# Patient Record
Sex: Male | Born: 1985
Health system: Southern US, Community
[De-identification: ages and names within clinical notes are randomized; demographics above are authoritative.]

## PROBLEM LIST (undated history)

## (undated) DIAGNOSIS — M109 Gout, unspecified: Secondary | ICD-10-CM

## (undated) DIAGNOSIS — M1990 Unspecified osteoarthritis, unspecified site: Secondary | ICD-10-CM

## (undated) DIAGNOSIS — N182 Chronic kidney disease, stage 2 (mild): Secondary | ICD-10-CM

## (undated) DIAGNOSIS — I10 Essential (primary) hypertension: Secondary | ICD-10-CM

## (undated) DIAGNOSIS — E669 Obesity, unspecified: Secondary | ICD-10-CM

## (undated) HISTORY — DX: Essential (primary) hypertension: I10

## (undated) HISTORY — DX: Chronic kidney disease, stage 2 (mild): N18.2

## (undated) HISTORY — DX: Obesity, unspecified: E66.9

---
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
## 2005-05-10 LAB — CONVERTED CEMR LAB
ALT: 37 units/L
CO2: 30 meq/L
Cholesterol: 151 mg/dL
Creatinine, Ser: 1.4 mg/dL
Direct LDL: 83 mg/dL
Total Bilirubin: 1.2 mg/dL
Total CHOL/HDL Ratio: 3.51
Triglycerides: 182 mg/dL

## 2005-10-17 ENCOUNTER — Emergency Department (HOSPITAL_COMMUNITY): Admission: EM | Admit: 2005-10-17 | Discharge: 2005-10-17 | Payer: Self-pay | Admitting: Emergency Medicine

## 2006-09-10 ENCOUNTER — Emergency Department (HOSPITAL_COMMUNITY): Admission: EM | Admit: 2006-09-10 | Discharge: 2006-09-10 | Payer: Self-pay | Admitting: Family Medicine

## 2006-12-13 LAB — CONVERTED CEMR LAB
CO2: 27 meq/L
Chloride: 106 meq/L
Creatinine, Ser: 1.4 mg/dL
Glucose, Bld: 97 mg/dL

## 2007-12-15 ENCOUNTER — Emergency Department (HOSPITAL_COMMUNITY): Admission: EM | Admit: 2007-12-15 | Discharge: 2007-12-15 | Payer: Self-pay | Admitting: Emergency Medicine

## 2008-02-09 ENCOUNTER — Encounter: Payer: Self-pay | Admitting: Internal Medicine

## 2008-02-09 LAB — CONVERTED CEMR LAB
Chloride: 101 meq/L
Creatinine, Ser: 1.7 mg/dL
Potassium: 4.2 meq/L
Sodium: 137 meq/L

## 2008-03-08 ENCOUNTER — Encounter: Payer: Self-pay | Admitting: Internal Medicine

## 2008-06-11 ENCOUNTER — Emergency Department (HOSPITAL_COMMUNITY): Admission: EM | Admit: 2008-06-11 | Discharge: 2008-06-11 | Payer: Self-pay | Admitting: Emergency Medicine

## 2008-11-27 ENCOUNTER — Emergency Department (HOSPITAL_COMMUNITY): Admission: EM | Admit: 2008-11-27 | Discharge: 2008-11-27 | Payer: Self-pay | Admitting: Emergency Medicine

## 2009-02-13 ENCOUNTER — Encounter: Payer: Self-pay | Admitting: Internal Medicine

## 2009-02-13 ENCOUNTER — Ambulatory Visit: Payer: Self-pay | Admitting: Internal Medicine

## 2009-02-13 DIAGNOSIS — I10 Essential (primary) hypertension: Secondary | ICD-10-CM | POA: Insufficient documentation

## 2009-02-13 DIAGNOSIS — E669 Obesity, unspecified: Secondary | ICD-10-CM | POA: Insufficient documentation

## 2009-06-09 ENCOUNTER — Encounter: Payer: Self-pay | Admitting: Internal Medicine

## 2009-09-16 ENCOUNTER — Telehealth: Payer: Self-pay | Admitting: Internal Medicine

## 2009-09-18 ENCOUNTER — Ambulatory Visit: Payer: Self-pay | Admitting: Internal Medicine

## 2009-09-22 LAB — CONVERTED CEMR LAB
CO2: 26 meq/L (ref 19–32)
Calcium: 9.1 mg/dL (ref 8.4–10.5)
Creatinine, Ser: 1.49 mg/dL (ref 0.40–1.50)
Glucose, Bld: 106 mg/dL — ABNORMAL HIGH (ref 70–99)
Hemoglobin, Urine: NEGATIVE
Ketones, ur: NEGATIVE mg/dL
Microalb Creat Ratio: 10.7 mg/g (ref 0.0–30.0)
Nitrite: NEGATIVE
Protein, ur: NEGATIVE mg/dL
Urobilinogen, UA: 0.2 (ref 0.0–1.0)

## 2009-10-06 ENCOUNTER — Ambulatory Visit: Payer: Self-pay | Admitting: Internal Medicine

## 2009-10-08 ENCOUNTER — Ambulatory Visit (HOSPITAL_COMMUNITY): Admission: RE | Admit: 2009-10-08 | Discharge: 2009-10-08 | Payer: Self-pay | Admitting: Internal Medicine

## 2009-10-13 ENCOUNTER — Telehealth: Payer: Self-pay | Admitting: Internal Medicine

## 2010-07-12 ENCOUNTER — Encounter: Payer: Self-pay | Admitting: Internal Medicine

## 2010-07-21 NOTE — Assessment & Plan Note (Signed)
Summary: NEED MEDICATION/ ALVAREZ/ SB.   Vital Signs:  Patient profile:   25 year old male Height:      71 inches (180.34 cm) Weight:      307.6 pounds (136.95 kg) BMI:     43.06 Temp:     97.6 degrees F (36.44 degrees C) oral Pulse rate:   83 / minute BP sitting:   144 / 86  (right arm) Cuff size:   large  Vitals Entered By: Theotis Barrio NT II (September 18, 2009 9:53 AM) CC: MEDICATION REFILL ON BP  MED.  - 90 DAY SUPPLY  . / NO OTHER CONCERNS Is Patient Diabetic? No Pain Assessment Patient in pain? no      Nutritional Status BMI of > 30 = obese  Have you ever been in a relationship where you felt threatened, hurt or afraid?No   Does patient need assistance? Functional Status Self care Ambulation Normal Comments MEDICATIO REFILL ON BP MED.  /  90 DAY SUPPLY / NO OTHER CONCERNS AT THIS TIME   CC:  MEDICATION REFILL ON BP  MED.  - 90 DAY SUPPLY  . / NO OTHER CONCERNS.  History of Present Illness: [name] Coleman is a 25 yo man with HTN and renal impairment. Last seen in the clinic about six months ago. He comes in today asking for med refills. Denies any new complaints, wants to have meds refilled 90 day supply.   Current Medications (verified): 1)  Cardizem Cd 120 Mg Xr24h-Cap (Diltiazem Hcl Coated Beads) .... Take 1 Tablet By Mouth Once A Day 2)  Atenolol 100 Mg Tabs (Atenolol) .... Take 1 Tablet By Mouth Once A Day  Allergies (verified): No Known Drug Allergies  Past History:  Past Medical History: Last updated: 02/13/2009 HTN Obesity ? 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   Lungs:  normal respiratory effort and normal breath sounds.   Heart:  normal rate, regular rhythm, no murmur, and no gallop.   Abdomen:  soft and non-tender.   Pulses:  normal peripheral pulses  Extremities:  no cyanosis, clubbing or edema  Neurologic:  non focal.   Impression & Recommendations:  Problem # 1:  ? of CHRONIC KIDNEY DISEASE STAGE II (MILD) (ICD-585.2) Cr. noted to be 1.7 on last exam. Will get a repeat one today. Will need to figure out why he has renal failure. I reviewed Dr. Augusto Gamble note from last visit. Hematuria in 2002 and a normal US kidney. I will start the work up today with repeat US kidney, urinalysis. Review in two week's time and will consider nephrology referral for further work up.   Orders: T-Urine Microalbumin w/creat. ratio [id number]) T-Urinalysis ([id number]) Ultrasound (Ultrasound)  Problem # 2:  OBESITY (ICD-278.00) Life style advice given.   Problem # 3:  ESSENTIAL HYPERTENSION, BENIGN (  ICD-401.1) BP noted to be high. Will increase atenolol to 100 mg. Review in two week's time.   The following medications were removed from the medication list:    Atenolol 100 Mg Tabs (Atenolol) .Marland Kitchen... Take 1/2 tablet by mouth daily His updated medication list for this problem includes:    Cardizem Cd 120 Mg Xr24h-cap (Diltiazem hcl coated beads) .Marland Kitchen... Take 1 tablet by mouth once a day    Atenolol 100 Mg Tabs (Atenolol) .Marland Kitchen... Take 1 tablet by mouth once a day  Orders: T-Basic Metabolic Panel [phone])  Complete Medication List: 1)  Cardizem Cd 120 Mg Xr24h-cap (Diltiazem hcl coated beads) .... Take 1 tablet by mouth once a day 2)  Atenolol 100 Mg Tabs (Atenolol) .... Take 1  tablet by mouth once a day  Patient Instructions: 1)  Please schedule a follow-up appointment in 2 weeks. Prescriptions: CARDIZEM CD 120 MG XR24H-CAP (DILTIAZEM HCL COATED BEADS) Take 1 tablet by mouth once a day  #90 x 2   Entered and Authorized by:   Zara Council MD   Signed by:   Zara Council MD on 09/18/2009   Method used:   Faxed to ...       [facility] (retail)       [address] [place], [place]  [address]       Ph: [phone]       Fax: [phone]   RxID:   [id number] ATENOLOL 100 MG TABS (ATENOLOL) Take 1 tablet by mouth once a day  #90 x 2   Entered and Authorized by:   Zara Council MD   Signed by:   Zara Council MD on 09/18/2009   Method used:   Faxed to ...       [facility] (retail)       [address] [place], [place]  [address]       Ph: [phone]       Fax: [phone]   RxID:   [id number]   Prevention & Chronic Care Immunizations   Influenza vaccine: Not documented   Influenza vaccine deferral: Deferred  (09/18/2009)    Tetanus booster: 05/10/2005: Historical    Pneumococcal vaccine: Not documented  Other Screening   Smoking status: quit  (02/13/2009)  Hypertension   Last Blood Pressure: 144 / 86  (09/18/2009)   Serum creatinine: 1.70  (02/09/2008)   BMP action: Ordered   Serum potassium 4.2  (02/09/2008)    Hypertension flowsheet reviewed?: Yes   Progress toward BP goal: Unchanged  Self-Management Support :   Personal Goals (by the next clinic visit) :      Personal blood pressure goal: 140/90  (09/18/2009)   Patient will work on the following items until the next clinic visit to reach self-care goals:     Medications and monitoring: take my medicines every day  (09/18/2009)     Eating: eat more vegetables, use fresh or frozen vegetables, eat foods that are low in salt, eat baked foods instead of fried foods, limit or avoid alcohol  (09/18/2009)    Hypertension  self-management support: Resources for patients handout  (09/18/2009)      Resource handout printed.  Process Orders Check Orders Results:     [facility]: ABN not required for this insurance Tests Sent for requisitioning (September 18, 2009 10:26 AM):     09/18/2009: [facility] -- T-Basic Metabolic Panel [phone] (signed)  09/18/2009: [facility] -- T-Urine Microalbumin w/creat. ratio [[id number]] (signed)     09/18/2009: [facility] -- T-Urinalysis [[id number]] (signed)    Appended Document: Orders Update    Clinical Lists Changes  Orders: Added new Referral order of Nephrology Referral (Nephro) - Signed

## 2010-07-21 NOTE — Progress Notes (Signed)
Summary: Results  Phone Note Outgoing Call   Call placed by: Angelina Ok RN,  October 13, 2009 9:04 AM Call placed to: Patient Summary of Call: Call to pt to inform him that his labs and MRI results were normal.  Message left for pt to call the Clinics. Angelina Ok RN  October 13, 2009 9:04 AM RTC call to pt given results of labs and MRI.  Pt wanted to know when he needs to follow up? Angelina Ok RN  October 13, 2009 12:22 PM   Initial call taken by: Angelina Ok RN,  October 13, 2009 9:04 AM  Follow-up for Phone Call        he can keep his apopintment for 1 month f/u from last visit. Follow-up by: Mariea Stable MD,  October 13, 2009 3:30 PM  Additional Follow-up for Phone Call Additional follow up Details #1::        RTC to pt.   Message left for pt to call the Clinics regarding an appointment. Additional Follow-up by: Angelina Ok RN,  October 15, 2009 4:10 PM    Additional Follow-up for Phone Call Additional follow up Details #2::    Thanks Follow-up by: Mariea Stable MD,  Oct 20, 2009 6:41 AM

## 2010-07-21 NOTE — Progress Notes (Signed)
Summary: refill/ hla  Phone Note Refill Request Message from:  Patient on September 16, 2009 1:56 PM  Refills Requested: Medication #1:  ATENOLOL 100 MG TABS take 1/2 tablet by mouth daily  Medication #2:  CARDIZEM CD 120 MG XR24H-CAP Take 1 tablet by mouth once a day. Initial call taken by: Marin Roberts RN,  September 16, 2009 1:56 PM  Follow-up for Phone Call        Pt needs appointment to be seen.  Was supposed to f/u in 2 weeks and didn't (now 6 months or so)  Will provide 1 month supply and no more   Follow-up by: Mariea Stable MD,  September 16, 2009 5:33 PM    Prescriptions: CARDIZEM CD 120 MG XR24H-CAP (DILTIAZEM HCL COATED BEADS) Take 1 tablet by mouth once a day  #30 x 0   Entered and Authorized by:   Mariea Stable MD   Signed by:   Mariea Stable MD on 09/16/2009   Method used:   Electronically to        [facility]* (retail)       [address] [place], [place]  [address]       Ph: [phone] or [phone]       Fax: [phone]   RxID:   [id number] ATENOLOL 100 MG TABS (ATENOLOL) take 1/2 tablet by mouth daily  #30 x 0   Entered and Authorized by:   Mariea Stable MD   Signed by:   Mariea Stable MD on 09/16/2009   Method used:   Electronically to        [facility]* (retail)       [address] [place], [place]  [address]       Ph: [phone] or [phone]       Fax: [phone]   RxID:   [id number]

## 2010-07-21 NOTE — Assessment & Plan Note (Signed)
Summary: EST-2 WEEK RECHECK/CH   Vital Signs:  Patient profile:   25 year old male Height:      71 inches (180.34 cm) Weight:      304.03 pounds (138.20 kg) BMI:     42.56 Temp:     97.5 degrees F (36.39 degrees C) oral Pulse rate:   73 / minute BP sitting:   119 / 76  (right arm)  Vitals Entered By: Angelina Ok RN (October 06, 2009 3:40 PM) CC: Depression Pain Assessment Patient in pain? no      Nutritional Status BMI of > 30 = obese  Have you ever been in a relationship where you felt threatened, hurt or afraid?No   Does patient need assistance? Functional Status Self care Ambulation Normal Comments Check up.  Ultrasound   CC:  Depression.  History of Present Illness: [name] is a 25 yo man with pMH as outlined below.  He is here for f/u of BP and renal function.  Please refer to his initial visit for details on concerns.  He recently saw Dr. Polly Cobia who increased his atenolol since BP was not at goal.  He is otherwise doing well without any complaints.   Depression History:      The patient denies a depressed mood most of the day and a diminished interest in his usual daily activities.         Preventive Screening-Counseling & Management  Alcohol-Tobacco     Smoking Status: quit     Year Quit: 2 YEARS  Current Medications (verified): 1)  Cardizem Cd 120 Mg Xr24h-Cap (Diltiazem Hcl Coated Beads) .... Take 1 Tablet By Mouth Once A Day 2)  Atenolol 100 Mg Tabs (Atenolol) .... 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 CKD stage 2  (baseline Cr 1.4)      -06/23/00 Renal US: normal (performed 2/2 hematuria)      -08/2009:  BMP, UA and microalbumin/Cr wnl  Family History: Father:  alive;  CVA, DM, HLD, MM, CKD Mother:  alive;  healthy No early cardiac history 1 brother:  healthy 3 sisters:  one with DM and HTN      -sis with HTN and CKD and ? RAS (undergoing angiogram)  Review of Systems      See HPI  Physical Exam  General:  alert and overweight-appearing.   Lungs:  normal respiratory effort and no accessory muscle use.   Neurologic:  non focal. Psych:  Oriented X3, memory intact for recent and remote, normally interactive, good eye contact, not anxious appearing, and not depressed appearing.     Impression & Recommendations:  Problem # 1:  ESSENTIAL HYPERTENSION, BENIGN (ICD-401.1)  will check aldo/renin ratio to r/o hyperaldo in setting of young person with hypertension will also check MRA renal arteries given small bump in Cr in the past with use of ACE-I (though potentially not large enough bump)  His updated medication list for this problem includes:    Cardizem Cd 120 Mg Xr24h-cap (Diltiazem hcl coated beads) .Marland Kitchen... Take 1 tablet by mouth once a day    Atenolol 100 Mg Tabs (  Atenolol) .Marland Kitchen... Take 1 tablet by mouth once a day  Orders: T- * Misc. Laboratory test [phone]) MRI (MRI)  Problem # 2:  ? of CHRONIC KIDNEY DISEASE STAGE II (MILD) (ICD-585.2) Unclear if true CKD or if high muscle mass giving borderline high Cr values      Cr stable, UA negative, Microalb/Cr wnl making renal dz less likely  will also check MRA renal arteries given small bump in Cr in the past with use of ACE-I (though potentially not large enough bump)  Orders: MRI (MRI)  Complete Medication List: 1)  Cardizem Cd 120 Mg Xr24h-cap (Diltiazem hcl coated beads) .... Take 1 tablet by mouth once a day 2)  Atenolol 100 Mg Tabs (Atenolol) .... Take 1 tablet by mouth once a day  Patient Instructions: 1)  Please schedule a  follow-up appointment in 1 month. 2)  Will check blood work today as discussed 3)  Will schedule MRA of the kidneys as discussed. 4)  If you have any problems before your next appointment, call clinic.   Prevention & Chronic Care Immunizations   Influenza vaccine: Not documented   Influenza vaccine deferral: Deferred  (09/18/2009)    Tetanus booster: 05/10/2005: Historical    Pneumococcal vaccine: Not documented  Other Screening   Smoking status: quit  (10/06/2009)  Hypertension   Last Blood Pressure: 119 / 76  (10/06/2009)   Serum creatinine: 1.49  (09/18/2009)   BMP action: Ordered   Serum potassium 3.6  (09/18/2009)  Self-Management Support :   Personal Goals (by the next clinic visit) :      Personal blood pressure goal: 140/90  (09/18/2009)   Patient will work on the following items until the next clinic visit to reach self-care goals:     Medications and monitoring: take my medicines every day, bring all of my medications to every visit  (10/06/2009)     Eating: drink diet soda or water instead of juice or soda, eat more vegetables, use fresh or frozen vegetables, eat foods that are low in salt, eat baked foods instead of fried foods, eat fruit for snacks and desserts, limit or avoid alcohol  (10/06/2009)     Activity: take a 30 minute walk every day, take the stairs instead of the elevator, park at the far end of the parking lot  (10/06/2009)    Hypertension self-management support: Written self-care plan, Education handout, Pre-printed educational material  (10/06/2009)   Hypertension self-care plan printed.   Hypertension education handout printed  Process Orders Check Orders Results:     [facility]: ABN not required for this insurance Tests Sent for requisitioning (October 06, 2009 5:06 PM):     10/06/2009: [facility] -- T- * Misc. Laboratory test [phone] (signed)     Vital Signs:  Patient profile:   25 year old male Height:       71 inches (180.34 cm) Weight:      304.03 pounds (138.20 kg) BMI:     42.56 Temp:     97.5 degrees F (36.39 degrees C) oral Pulse rate:   73 / minute BP sitting:   119 / 76  (right arm)  Vitals Entered By: Angelina Ok RN (October 06, 2009 3:40 PM)

## 2010-09-07 ENCOUNTER — Other Ambulatory Visit: Payer: Self-pay | Admitting: Internal Medicine

## 2010-09-08 NOTE — Telephone Encounter (Signed)
Patient should have an appointment soon, not urgent.

## 2010-09-16 ENCOUNTER — Other Ambulatory Visit: Payer: Self-pay | Admitting: Internal Medicine

## 2010-10-01 ENCOUNTER — Encounter: Payer: Self-pay | Admitting: Internal Medicine

## 2010-11-08 ENCOUNTER — Emergency Department (HOSPITAL_BASED_OUTPATIENT_CLINIC_OR_DEPARTMENT_OTHER)
Admission: EM | Admit: 2010-11-08 | Discharge: 2010-11-08 | Disposition: A | Discharge status: Home / Self Care | Payer: BC Managed Care – PPO | Attending: Emergency Medicine | Admitting: Emergency Medicine

## 2010-11-08 ENCOUNTER — Emergency Department (INDEPENDENT_AMBULATORY_CARE_PROVIDER_SITE_OTHER): Payer: BC Managed Care – PPO

## 2010-11-08 DIAGNOSIS — I10 Essential (primary) hypertension: Secondary | ICD-10-CM | POA: Insufficient documentation

## 2010-11-08 DIAGNOSIS — M25569 Pain in unspecified knee: Secondary | ICD-10-CM

## 2010-12-02 ENCOUNTER — Encounter: Payer: Self-pay | Admitting: Internal Medicine

## 2011-07-12 ENCOUNTER — Other Ambulatory Visit: Payer: Self-pay | Admitting: Internal Medicine

## 2012-01-17 ENCOUNTER — Other Ambulatory Visit: Payer: Self-pay | Admitting: *Deleted

## 2012-01-17 MED ORDER — DILTIAZEM HCL ER COATED BEADS 120 MG PO CP24: 120.0000 mg | ORAL_CAPSULE | Freq: Every day | ORAL | Status: DC

## 2012-10-30 IMAGING — US US EXTREM LOW VENOUS*R*
1 series · 14 of 24 positions shown · non-contrast
Comparison: None.

CLINICAL DATA: Right posterior knee pain

RIGHT LOWER EXTREMITY VENOUS DUPLEX ULTRASOUND
TECHNIQUE: Gray-scale sonography with graded compression, as well
as color Doppler and duplex ultrasound were performed to evaluate
the deep venous system of the lower extremity from the level of the
common femoral vein through the popliteal and proximal calf veins.
Spectral Doppler was utilized to evaluate flow at rest and with
distal augmentation maneuvers.

[Series 1: us extrem low venous*right* · 14 of 27 slices shown]
[im 1/27]
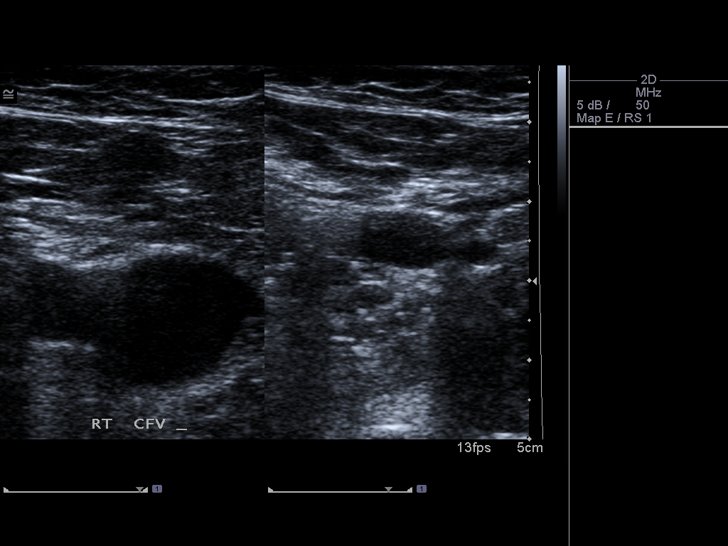
[im 3/27]
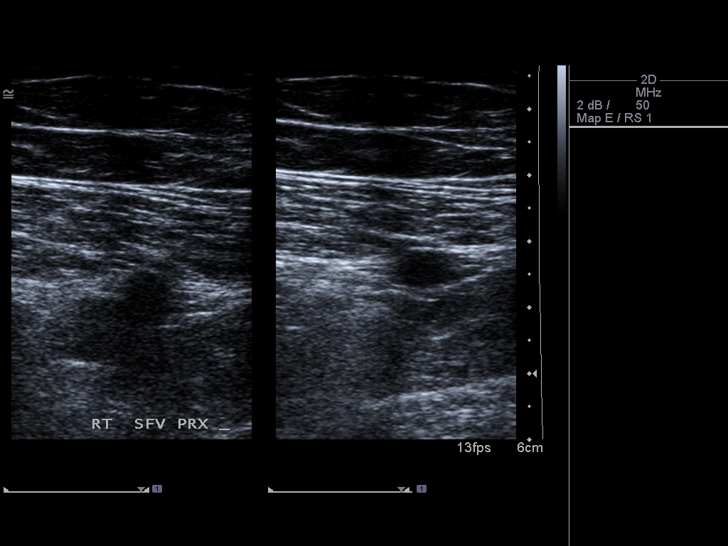
[im 5/27]
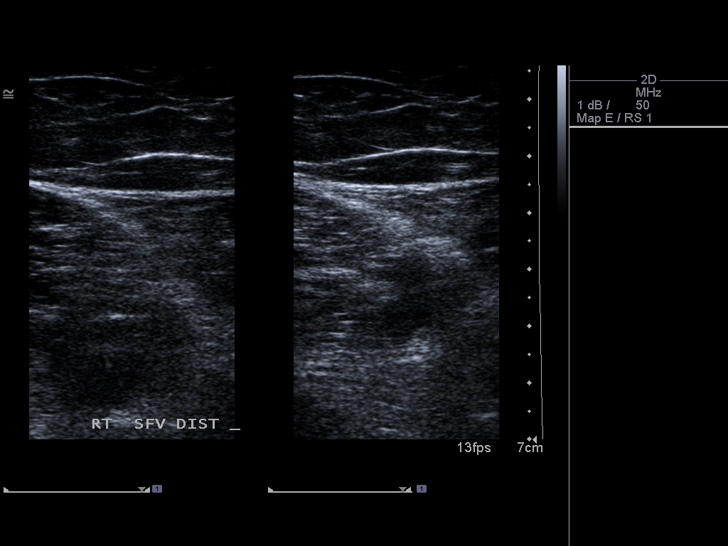
[im 7/27]
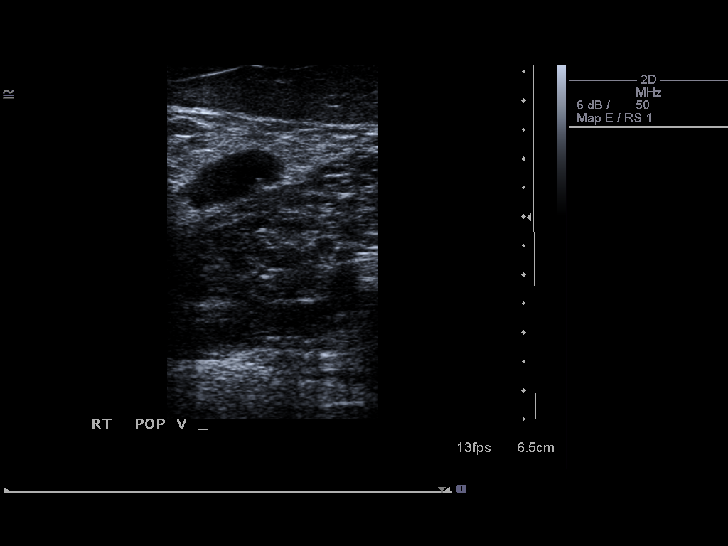
[im 8/27]
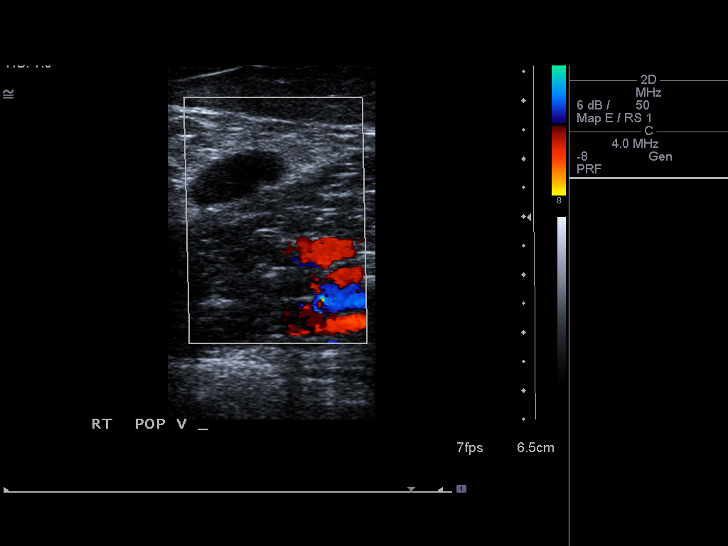
[im 11/27]
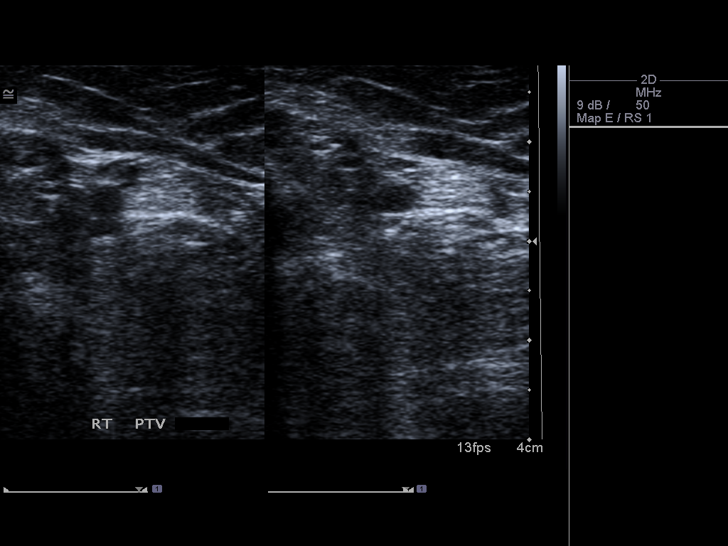
[im 13/27]
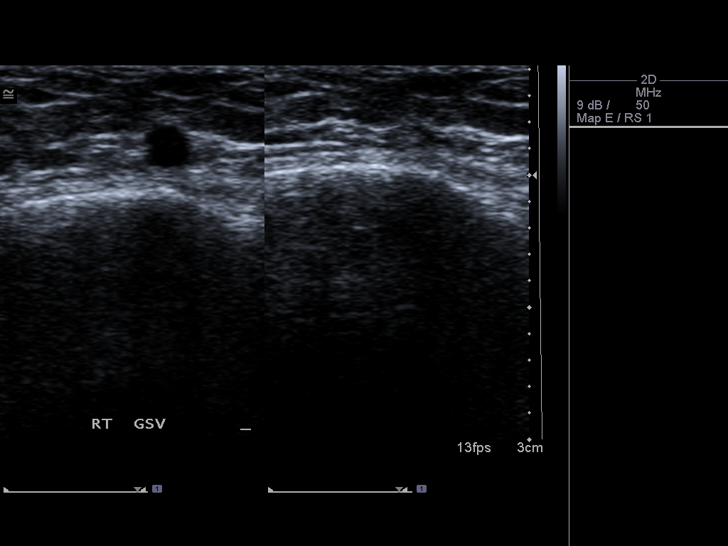
[im 14/27]
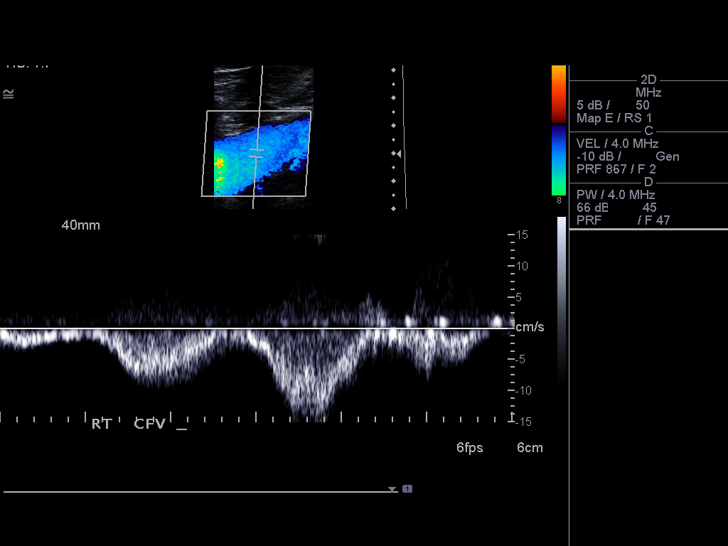
[im 16/27]
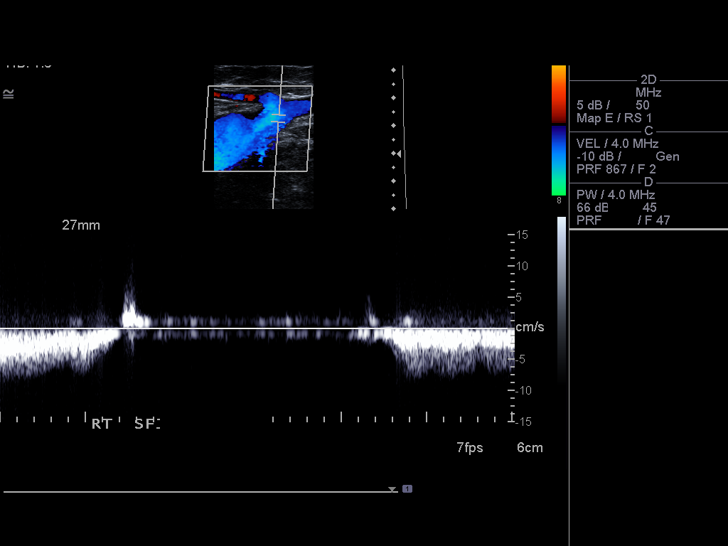
[im 19/27]
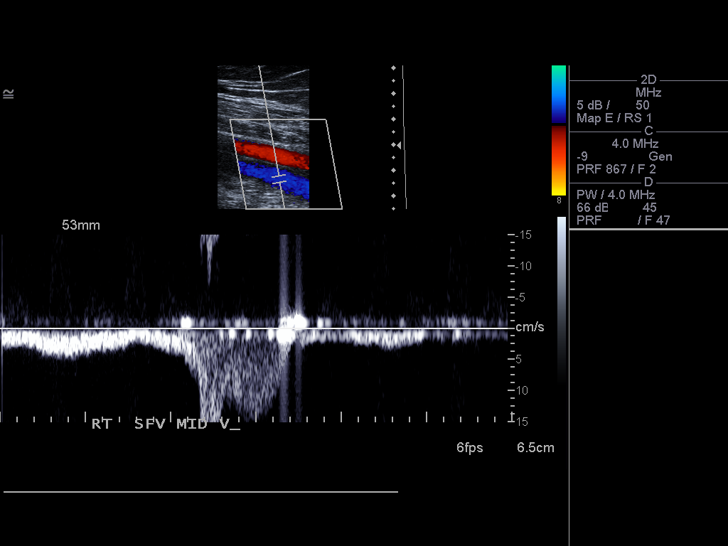
[im 21/27]
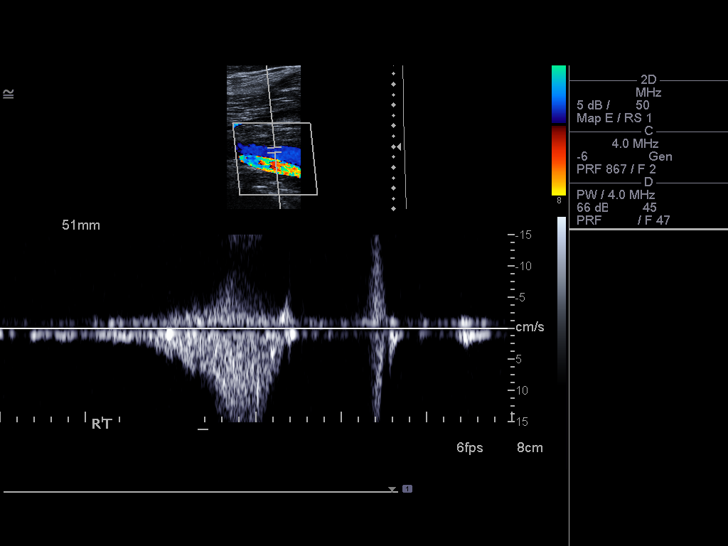
[im 22/27]
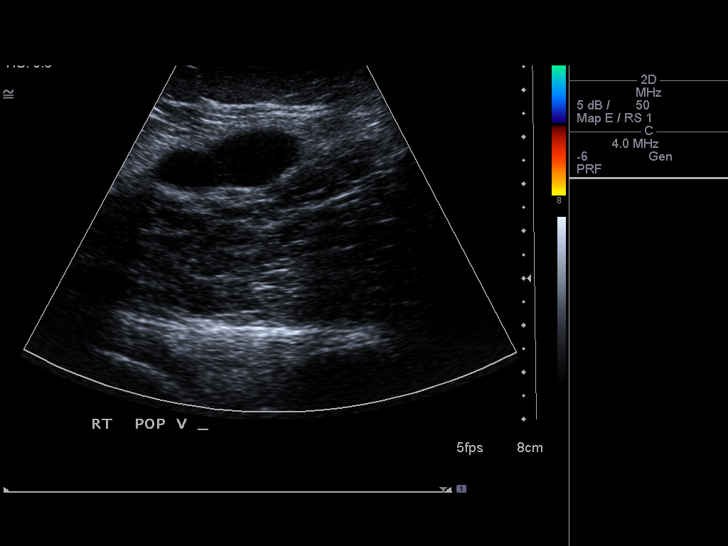
[im 24/27]
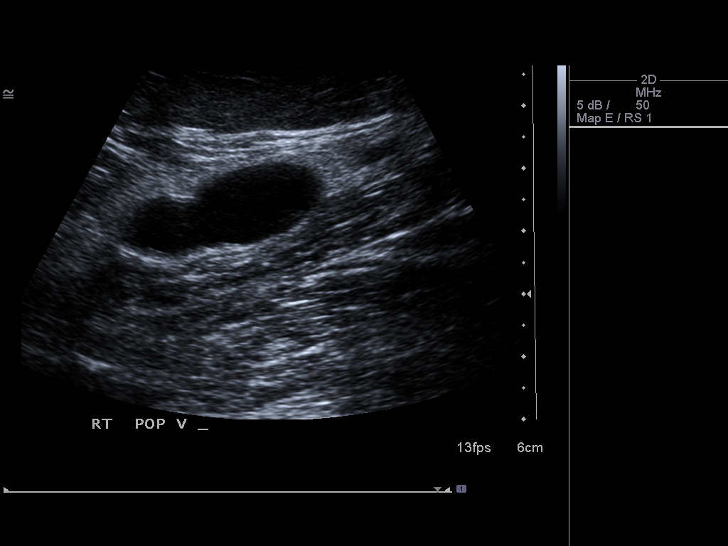
[im 27/27]
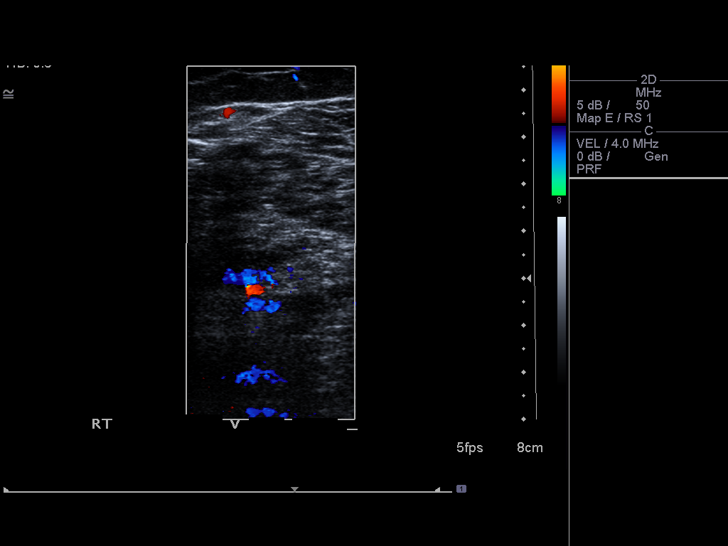

[14 of 24 positions shown; findings below may reference images not displayed]

FINDINGS: Normal compressibility of the common femoral,
superficial femoral, and popliteal veins is demonstrated, as well
as the visualized proximal calf veins.  No filling defects to
suggest DVT on grayscale or color Doppler imaging.  Doppler
waveforms show normal direction of venous flow, normal respiratory
phasicity and response to augmentation.

Popliteal fossa cyst measures 3.4 x 1.5 cm and may represent a
Baker's cyst.
IMPRESSION: No evidence of lower extremity deep vein thrombosis.

Popliteal  fossa cyst.

## 2014-07-08 ENCOUNTER — Other Ambulatory Visit: Payer: Self-pay | Admitting: Physician Assistant

## 2014-07-10 ENCOUNTER — Encounter (HOSPITAL_BASED_OUTPATIENT_CLINIC_OR_DEPARTMENT_OTHER): Payer: Self-pay | Admitting: *Deleted

## 2014-07-15 ENCOUNTER — Encounter (HOSPITAL_BASED_OUTPATIENT_CLINIC_OR_DEPARTMENT_OTHER)
Admission: RE | Admit: 2014-07-15 | Discharge: 2014-07-15 | Disposition: A | Discharge status: Home / Self Care | Payer: BLUE CROSS/BLUE SHIELD | Source: Ambulatory Visit | Attending: Orthopedic Surgery | Admitting: Orthopedic Surgery

## 2014-07-15 ENCOUNTER — Other Ambulatory Visit: Payer: Self-pay

## 2014-07-15 DIAGNOSIS — S83272A Complex tear of lateral meniscus, current injury, left knee, initial encounter: Secondary | ICD-10-CM | POA: Diagnosis not present

## 2014-07-15 DIAGNOSIS — M179 Osteoarthritis of knee, unspecified: Secondary | ICD-10-CM | POA: Diagnosis not present

## 2014-07-15 DIAGNOSIS — M25562 Pain in left knee: Secondary | ICD-10-CM | POA: Diagnosis present

## 2014-07-15 DIAGNOSIS — M659 Synovitis and tenosynovitis, unspecified: Secondary | ICD-10-CM | POA: Diagnosis not present

## 2014-07-15 DIAGNOSIS — M2242 Chondromalacia patellae, left knee: Secondary | ICD-10-CM | POA: Diagnosis not present

## 2014-07-15 DIAGNOSIS — Z8639 Personal history of other endocrine, nutritional and metabolic disease: Secondary | ICD-10-CM | POA: Diagnosis not present

## 2014-07-15 DIAGNOSIS — X58XXXA Exposure to other specified factors, initial encounter: Secondary | ICD-10-CM | POA: Diagnosis not present

## 2014-07-15 LAB — BASIC METABOLIC PANEL
Anion gap: 9 (ref 5–15)
BUN: 18 mg/dL (ref 6–23)
CO2: 29 mmol/L (ref 19–32)
CREATININE: 1.06 mg/dL (ref 0.50–1.35)
Calcium: 9.2 mg/dL (ref 8.4–10.5)
Chloride: 102 mmol/L (ref 96–112)
GFR calc Af Amer: >90 mL/min (ref >90)
GFR calc non Af Amer: >90 mL/min (ref >90)
Glucose, Bld: 101 mg/dL — ABNORMAL HIGH (ref 70–99)
Potassium: 4.1 mmol/L (ref 3.5–5.1)
SODIUM: 140 mmol/L (ref 135–145)

## 2014-07-17 NOTE — H&P (Signed)
  Coleman Coleman/[facility] [address]   [address] [place], [place] [id number] [phone] A Division of [facility]  [name] Aveaniel F. Keymora Grillot, M.D.   Coleman Coleman HoleWainer, M.D.   Coleman BlanksW. Dan Coleman, M.D.   [name] PostJoshua P. Coleman, M.D.   Coleman StandsAnna Coleman, M.D [name] Baizeimothy D. Coleman Coleman, M.D.  [name] DresserWesley R. Coleman, M.D.  [name] HarpinJames S. Coleman, M.D.    Coleman Coleman, M.D. [name] L. Isidoro DonningAnton, PA-C  Coleman A. Shepperson, PA-C  [name] Tularehadwell, PA-C LafeBrandon Coleman, [place]A-C   RE: Coleman DutchSneed, Coleman                                [phone]      DOB: [birth year] PROGRESS NOTE: 07-02-14 This is a 29 year-old male who presents to our clinic today to discuss MRI results of both knees.  Coleman Coleman has a longstanding history of bilateral knee pain that has progressively worsened over the past several months.  He continues to have marked mechanical symptoms, but no real signs of instability.  He also states that he has a history of gout for which he takes over the counter Allopurinol.   Past medical, social and family history reviewed in detail on the patient questionnaire and signed.  Review of systems: As detailed in HPI.  All others reviewed and are negative.   EXAMINATION: Well-developed, well-nourished male in no acute distress.  Alert and oriented x 3.  Examination of both knees reveals a trace effusion bilaterally.  Medial and lateral joint line tenderness to palpation.  Minimal patellofemoral crepitus.  Negative log roll.  Negative straight leg raise both sides.    X-RAYS: MRI of the left knee from June 27, 2014 reveals a lateral meniscus tear, as well as a moderate degree of degenerative changes, which is unusual for this age group.  MRI results of the right knee are negative for meniscal pathology.  There are still quite a bit of inflammatory changes, which is unusual for this age group as well.    DISPOSITION:  Because of [name] Coleman's previously diagnosed gout was based on a blood test and not a  joint aspiration, we are proceeding with a joint aspiration of the right knee and will send this off for cell and crystal count.  Today we also proceeded with a right knee intraarticular injection of 2:6 Depo-Medrol/Marcaine.  [facility] to [facility] we are going to proceed with arthroscopic debridement and lateral meniscectomy.  Risks, benefits and possible complications of Coleman have been reviewed.  Rehab and recovery time discussed.  All questions have been answered.  We will see Coleman Coleman.  In the meantime we are referring him to a rheumatologist to rule out possible inflammatory arthropathy.    PROCEDURE NOTE: The patient's clinical condition is marked by substantial pain and/or significant functional disability.  Other conservative therapy has not provided relief, is contraindicated, or not appropriate.  There is a reasonable likelihood that injection will significantly improve the patient's pain and/or functional disability. Patient is seated on the exam table.  The right knee is prepped with Betadine and alcohol and injected with 2:6 Depo-Medrol/Marcaine.  Patient tolerated the procedure without difficulty.   [name] EpleyMary Lindsey Anton, PA-C   Electronically verified by [name] Aveaniel F. Mikolaj Coleman, M.D. DFM(MLA):jjh D 07-03-14 T 07-04-14

## 2014-07-18 ENCOUNTER — Ambulatory Visit (HOSPITAL_BASED_OUTPATIENT_CLINIC_OR_DEPARTMENT_OTHER): Payer: BLUE CROSS/BLUE SHIELD | Admitting: Anesthesiology

## 2014-07-18 ENCOUNTER — Encounter (HOSPITAL_BASED_OUTPATIENT_CLINIC_OR_DEPARTMENT_OTHER)
Admission: RE | Disposition: A | Discharge status: Home / Self Care | Payer: Self-pay | Source: Ambulatory Visit | Attending: Orthopedic Surgery

## 2014-07-18 ENCOUNTER — Encounter (HOSPITAL_BASED_OUTPATIENT_CLINIC_OR_DEPARTMENT_OTHER): Payer: Self-pay | Admitting: Anesthesiology

## 2014-07-18 ENCOUNTER — Ambulatory Visit (HOSPITAL_BASED_OUTPATIENT_CLINIC_OR_DEPARTMENT_OTHER)
Admission: RE | Admit: 2014-07-18 | Discharge: 2014-07-18 | Disposition: A | Discharge status: Home / Self Care | Payer: BLUE CROSS/BLUE SHIELD | Source: Ambulatory Visit | Attending: Orthopedic Surgery | Admitting: Orthopedic Surgery

## 2014-07-18 DIAGNOSIS — M179 Osteoarthritis of knee, unspecified: Secondary | ICD-10-CM | POA: Diagnosis not present

## 2014-07-18 DIAGNOSIS — X58XXXA Exposure to other specified factors, initial encounter: Secondary | ICD-10-CM | POA: Insufficient documentation

## 2014-07-18 DIAGNOSIS — Z8639 Personal history of other endocrine, nutritional and metabolic disease: Secondary | ICD-10-CM | POA: Insufficient documentation

## 2014-07-18 DIAGNOSIS — M659 Synovitis and tenosynovitis, unspecified: Secondary | ICD-10-CM | POA: Insufficient documentation

## 2014-07-18 DIAGNOSIS — M2242 Chondromalacia patellae, left knee: Secondary | ICD-10-CM | POA: Insufficient documentation

## 2014-07-18 DIAGNOSIS — S83272A Complex tear of lateral meniscus, current injury, left knee, initial encounter: Secondary | ICD-10-CM | POA: Insufficient documentation

## 2014-07-18 HISTORY — PX: SYNOVECTOMY: SHX5180

## 2014-07-18 HISTORY — DX: Unspecified osteoarthritis, unspecified site: M19.90

## 2014-07-18 HISTORY — PX: CHONDROPLASTY: SHX5177

## 2014-07-18 HISTORY — DX: Gout, unspecified: M10.9

## 2014-07-18 HISTORY — PX: KNEE ARTHROSCOPY WITH LATERAL MENISECTOMY: SHX6193

## 2014-07-18 LAB — POCT HEMOGLOBIN-HEMACUE: HEMOGLOBIN: 17.5 g/dL — AB (ref 13.0–17.0)

## 2014-07-18 SURGERY — ARTHROSCOPY, KNEE, WITH LATERAL MENISCECTOMY
Anesthesia: General | Site: Knee | Laterality: Left

## 2014-07-18 MED ORDER — OXYCODONE HCL 5 MG PO TABS: 5.0000 mg | ORAL_TABLET | Freq: Once | ORAL | Status: DC | PRN

## 2014-07-18 MED ORDER — OXYCODONE-ACETAMINOPHEN 5-325 MG PO TABS: 1.0000 | ORAL_TABLET | ORAL | Status: DC | PRN

## 2014-07-18 MED ORDER — ONDANSETRON HCL 4 MG/2ML IJ SOLN
INTRAMUSCULAR | Status: DC | PRN
  Administered 2014-07-18: 4 mg via INTRAVENOUS

## 2014-07-18 MED ORDER — MIDAZOLAM HCL 5 MG/5ML IJ SOLN
INTRAMUSCULAR | Status: DC | PRN
  Administered 2014-07-18: 2 mg via INTRAVENOUS

## 2014-07-18 MED ORDER — MIDAZOLAM HCL 2 MG/2ML IJ SOLN: 1.0000 mg | INTRAMUSCULAR | Status: DC | PRN

## 2014-07-18 MED ORDER — PROPOFOL 10 MG/ML IV BOLUS
INTRAVENOUS | Status: DC | PRN
  Administered 2014-07-18: 260 mg via INTRAVENOUS

## 2014-07-18 MED ORDER — FENTANYL CITRATE 0.05 MG/ML IJ SOLN
INTRAMUSCULAR | Status: DC | PRN
  Administered 2014-07-18: 100 ug via INTRAVENOUS
  Administered 2014-07-18: 50 ug via INTRAVENOUS

## 2014-07-18 MED ORDER — METHYLPREDNISOLONE ACETATE 80 MG/ML IJ SUSP
INTRAMUSCULAR | Status: DC | PRN
  Administered 2014-07-18: 80 mg via INTRA_ARTICULAR

## 2014-07-18 MED ORDER — MIDAZOLAM HCL 2 MG/2ML IJ SOLN
INTRAMUSCULAR | Status: AC
  Filled 2014-07-18: qty 2

## 2014-07-18 MED ORDER — LACTATED RINGERS IV SOLN
INTRAVENOUS | Status: DC
  Administered 2014-07-18: 10:00:00 via INTRAVENOUS

## 2014-07-18 MED ORDER — HYDROMORPHONE HCL 1 MG/ML IJ SOLN: 0.2500 mg | INTRAMUSCULAR | Status: DC | PRN

## 2014-07-18 MED ORDER — METHYLPREDNISOLONE ACETATE 80 MG/ML IJ SUSP
INTRAMUSCULAR | Status: AC
  Filled 2014-07-18: qty 1

## 2014-07-18 MED ORDER — CHLORHEXIDINE GLUCONATE 4 % EX LIQD: 60.0000 mL | Freq: Once | CUTANEOUS | Status: DC

## 2014-07-18 MED ORDER — KETOROLAC TROMETHAMINE 30 MG/ML IJ SOLN
INTRAMUSCULAR | Status: DC | PRN
  Administered 2014-07-18: 30 mg via INTRAVENOUS

## 2014-07-18 MED ORDER — FENTANYL CITRATE 0.05 MG/ML IJ SOLN
INTRAMUSCULAR | Status: AC
  Filled 2014-07-18: qty 4

## 2014-07-18 MED ORDER — DEXAMETHASONE SODIUM PHOSPHATE 4 MG/ML IJ SOLN
INTRAMUSCULAR | Status: DC | PRN
  Administered 2014-07-18: 10 mg via INTRAVENOUS

## 2014-07-18 MED ORDER — FENTANYL CITRATE 0.05 MG/ML IJ SOLN: 50.0000 ug | INTRAMUSCULAR | Status: DC | PRN

## 2014-07-18 MED ORDER — BUPIVACAINE HCL (PF) 0.5 % IJ SOLN
INTRAMUSCULAR | Status: DC | PRN
  Administered 2014-07-18: 25 mL via INTRA_ARTICULAR

## 2014-07-18 MED ORDER — MEPERIDINE HCL 25 MG/ML IJ SOLN: 6.2500 mg | INTRAMUSCULAR | Status: DC | PRN

## 2014-07-18 MED ORDER — LIDOCAINE HCL (CARDIAC) 20 MG/ML IV SOLN
INTRAVENOUS | Status: DC | PRN
  Administered 2014-07-18: 50 mg via INTRAVENOUS

## 2014-07-18 MED ORDER — CEFAZOLIN SODIUM 1-5 GM-% IV SOLN
INTRAVENOUS | Status: AC
  Filled 2014-07-18: qty 50

## 2014-07-18 MED ORDER — ONDANSETRON HCL 4 MG PO TABS: 4.0000 mg | ORAL_TABLET | Freq: Three times a day (TID) | ORAL | Status: DC | PRN

## 2014-07-18 MED ORDER — PROMETHAZINE HCL 25 MG/ML IJ SOLN: 6.2500 mg | INTRAMUSCULAR | Status: DC | PRN

## 2014-07-18 MED ORDER — OXYCODONE HCL 5 MG/5ML PO SOLN
5.0000 mg | Freq: Once | ORAL | Status: DC | PRN
Start: 2014-07-18 — End: 2014-07-18

## 2014-07-18 MED ORDER — DEXTROSE 5 % IV SOLN
3.0000 g | INTRAVENOUS | Status: AC
  Administered 2014-07-18: 3 g via INTRAVENOUS

## 2014-07-18 MED ORDER — SODIUM CHLORIDE 0.9 % IR SOLN
Status: DC | PRN
  Administered 2014-07-18: 3000 mL

## 2014-07-18 MED ORDER — CEFAZOLIN SODIUM-DEXTROSE 2-3 GM-% IV SOLR
INTRAVENOUS | Status: AC
  Filled 2014-07-18: qty 50

## 2014-07-18 SURGICAL SUPPLY — 38 items
BANDAGE ELASTIC 6 VELCRO ST LF (GAUZE/BANDAGES/DRESSINGS) ×2 IMPLANT
BLADE CUDA 5.5 (BLADE) IMPLANT
BLADE CUDA GRT WHITE 3.5 (BLADE) IMPLANT
BLADE CUTTER GATOR 3.5 (BLADE) ×2 IMPLANT
BLADE CUTTER MENIS 5.5 (BLADE) IMPLANT
BLADE GREAT WHITE 4.2 (BLADE) ×2 IMPLANT
BUR OVAL 4.0 (BURR) IMPLANT
CANISTER SUCT 3000ML (MISCELLANEOUS) IMPLANT
CUTTER MENISCUS  4.2MM (BLADE)
CUTTER MENISCUS 4.2MM (BLADE) IMPLANT
DRAPE ARTHROSCOPY W/POUCH 90 (DRAPES) ×2 IMPLANT
DURAPREP 26ML APPLICATOR (WOUND CARE) ×2 IMPLANT
ELECT MENISCUS 165MM 90D (ELECTRODE) IMPLANT
ELECT REM PT RETURN 9FT ADLT (ELECTROSURGICAL) ×2
ELECTRODE REM PT RTRN 9FT ADLT (ELECTROSURGICAL) ×1 IMPLANT
GAUZE SPONGE 4X4 12PLY STRL (GAUZE/BANDAGES/DRESSINGS) ×2 IMPLANT
GAUZE XEROFORM 1X8 LF (GAUZE/BANDAGES/DRESSINGS) ×2 IMPLANT
GLOVE BIOGEL PI IND STRL 7.0 (GLOVE) ×2 IMPLANT
GLOVE BIOGEL PI INDICATOR 7.0 (GLOVE) ×2
GLOVE ECLIPSE 6.5 STRL STRAW (GLOVE) ×2 IMPLANT
GLOVE ECLIPSE 7.0 STRL STRAW (GLOVE) ×2 IMPLANT
GLOVE EXAM NITRILE LRG STRL (GLOVE) ×2 IMPLANT
GLOVE ORTHO TXT STRL SZ7.5 (GLOVE) ×2 IMPLANT
GOWN STRL REUS W/ TWL LRG LVL3 (GOWN DISPOSABLE) ×3 IMPLANT
GOWN STRL REUS W/TWL LRG LVL3 (GOWN DISPOSABLE) ×3
HOLDER KNEE FOAM BLUE (MISCELLANEOUS) ×2 IMPLANT
IV NS IRRIG 3000ML ARTHROMATIC (IV SOLUTION) ×2 IMPLANT
KNEE WRAP E Z 3 GEL PACK (MISCELLANEOUS) ×2 IMPLANT
MANIFOLD NEPTUNE II (INSTRUMENTS) ×2 IMPLANT
PACK ARTHROSCOPY DSU (CUSTOM PROCEDURE TRAY) ×2 IMPLANT
PACK BASIN DAY SURGERY FS (CUSTOM PROCEDURE TRAY) ×2 IMPLANT
PENCIL BUTTON HOLSTER BLD 10FT (ELECTRODE) IMPLANT
SET ARTHROSCOPY TUBING (MISCELLANEOUS) ×1
SET ARTHROSCOPY TUBING LN (MISCELLANEOUS) ×1 IMPLANT
SUT ETHILON 3 0 PS 1 (SUTURE) ×2 IMPLANT
SUT VIC AB 3-0 FS2 27 (SUTURE) IMPLANT
TOWEL OR 17X24 6PK STRL BLUE (TOWEL DISPOSABLE) ×2 IMPLANT
WATER STERILE IRR 1000ML POUR (IV SOLUTION) ×2 IMPLANT

## 2014-07-18 NOTE — Anesthesia Postprocedure Evaluation (Signed)
Anesthesia Post Note  Patient: [name] Coleman  Procedure(s) Performed: Procedure(s) (LRB): LEFT KNEE ARTHROSCOPY WITH CHONDROPLASTY AND LATERAL MENISCECTOMY, PARTIAL SYNOVECTOMY (Left) CHONDROPLASTY (Left) SYNOVECTOMY (Left)  Anesthesia type: General  Patient location: PACU  Post pain: Pain level controlled  Post assessment: Post-op Vital signs reviewed  Last Vitals: BP 136/78 mmHg  Pulse 73  Temp(Src) 36.7 C (Oral)  Resp 16  Ht 5\' 11"  (1.803 m)  Wt 290 lb (131.543 kg)  BMI 40.46 kg/m2  SpO2 100%  Post vital signs: Reviewed  Level of consciousness: sedated  Complications: No apparent anesthesia complications

## 2014-07-18 NOTE — Anesthesia Preprocedure Evaluation (Signed)
Anesthesia Evaluation  Patient identified by MRN, date of birth, ID band Patient awake    Reviewed: Allergy & Precautions, NPO status , Patient's Chart, lab work & pertinent test results, reviewed documented beta blocker date and time   Airway Mallampati: II  TM Distance: >3 FB Neck ROM: Full    Dental no notable dental hx.    Pulmonary former smoker,  breath sounds clear to auscultation  Pulmonary exam normal       Cardiovascular hypertension, Pt. on home beta blockers and Pt. on medications Rhythm:Regular Rate:Normal     Neuro/Psych negative neurological ROS  negative psych ROS   GI/Hepatic negative GI ROS, Neg liver ROS,   Endo/Other  Morbid obesity  Renal/GU Renal disease     Musculoskeletal  (+) Arthritis -,   Abdominal   Peds  Hematology negative hematology ROS (+)   Anesthesia Other Findings   Reproductive/Obstetrics negative OB ROS                             Anesthesia Physical Anesthesia Plan  ASA: III  Anesthesia Plan: General   Post-op Pain Management:    Induction: Intravenous  Airway Management Planned: LMA  Additional Equipment:   Intra-op Plan:   Post-operative Plan: Extubation in OR  Informed Consent: I have reviewed the patients History and Physical, chart, labs and discussed the procedure including the risks, benefits and alternatives for the proposed anesthesia with the patient or authorized representative who has indicated his/her understanding and acceptance.   Dental advisory given  Plan Discussed with: CRNA  Anesthesia Plan Comments:         Anesthesia Quick Evaluation

## 2014-07-18 NOTE — Transfer of Care (Signed)
Immediate Anesthesia Transfer of Care Note  Patient: [name] Coleman  Procedure(s) Performed: Procedure(s): LEFT KNEE ARTHROSCOPY WITH CHONDROPLASTY AND LATERAL MENISCECTOMY, PARTIAL SYNOVECTOMY (Left) CHONDROPLASTY (Left) SYNOVECTOMY (Left)  Patient Location: PACU  Anesthesia Type:General  Level of Consciousness: awake and sedated  Airway & Oxygen Therapy: Patient Spontanous Breathing  Post-op Assessment: Report given to PACU RN and Post -op Vital signs reviewed and stable  Post vital signs: Reviewed and stable  Last Vitals:  Filed Vitals:   07/18/14 0933  BP: 131/77  Pulse: 65  Temp: 36.8 C  Resp: 20    Complications: No apparent anesthesia complications

## 2014-07-18 NOTE — Anesthesia Procedure Notes (Signed)
Procedure Name: LMA Insertion Performed by: Jessee Mezera W Pre-anesthesia Checklist: Patient identified, Timeout performed, Emergency Drugs available, Suction available and Patient being monitored Patient Re-evaluated:Patient Re-evaluated prior to inductionOxygen Delivery Method: Circle system utilized Preoxygenation: Pre-oxygenation with 100% oxygen Intubation Type: IV induction Ventilation: Mask ventilation without difficulty LMA: LMA with gastric port inserted LMA Size: 4.0 Tube type: Oral Number of attempts: 1 Placement Confirmation: positive ETCO2 Tube secured with: Tape Dental Injury: Teeth and Oropharynx as per pre-operative assessment      

## 2014-07-18 NOTE — Interval H&P Note (Signed)
History and Physical Interval Note:  07/18/2014 7:26 AM  [name] BlacksmithWilliam R Butikofer  has presented today for surgery, with the diagnosis of CHONDROMALACIA PATELLAE LEFT KNEE DERANGEMENT OF POSTERIOR HORN OF MEDIAL MENISCUS DUE TO OLD TEAR OR INJURY LEFT KNEE   The various methods of treatment have been discussed with the patient and family. After consideration of risks, benefits and other options for treatment, the patient has consented to  Procedure(s): LEFT KNEE ARTHROSCOPY CHONDROPLASTY WITH MEDIAL MENISCECTOMY (Left) as a surgical intervention .  The patient's history has been reviewed, patient examined, no change in status, stable for surgery.  I have reviewed the patient's chart and labs.  Questions were answered to the patient's satisfaction.     Earnie Bechard F

## 2014-07-18 NOTE — Discharge Instructions (Signed)
Arthroscopic Procedure, Knee, Care After Refer to this sheet in the next few weeks. These discharge instructions provide you with general information on caring for yourself after you leave the hospital. Your health care provider may also give you specific instructions. Your treatment has been planned according to the most current medical practices available, but unavoidable complications sometimes occur. If you have any problems or questions after discharge, please call your health care provider. HOME CARE INSTRUCTIONS   Weight bearing as tolerated.  Remove bandages and apply band-aids in 3 days.  May shower in 3 days, but do not soak incisions.  May apply ice for up to 20 minutes at a time for pain and swelling.  Follow up appointment in one week.    It is normal to be sore for a couple days after surgery. See your health care provider if this seems to be getting worse rather than better.  Only take over-the-counter or prescription medicines for pain, discomfort, or fever as directed by your health care provider.  Take showers rather than baths, or as directed by your health care provider.  Change bandages (dressings) if necessary or as directed.  You may resume normal diet and activities as directed or allowed.  Avoid lifting and driving until you are directed otherwise.  Make an appointment to see your health care provider for stitches (suture) or staple removal as directed.  You may put ice on the area.  Put the ice in a plastic bag. Place a towel between your skin and the bag.  Leave the ice on for 15-20 minutes, three to four times per day for the first 2 days.  Elevate the knee above the level of your heart to reduce swelling, and avoid dangling the leg.  Do 10-15 ankle pumps (pointing your toes toward you and then away from you) two to three times daily.  If you are given compression stockings to wear after surgery, use them for as long as your surgeon tells you (around 10-14  days).  Avoid smoking and exposure to secondhand smoke. SEEK MEDICAL CARE IF:   You have increased bleeding from your wounds.  You see redness or swelling or you have increasing pain in your wounds.  You have pus coming from your wound.  You have a fever or persistent symptoms for more than 2-3 days.  You notice a bad smell coming from the wound or dressing.  You have severe pain with any motion of your knee. SEEK IMMEDIATE MEDICAL CARE IF:   You develop a rash.  You have difficulty breathing.  You develop any reaction or side effects to medicines taken.  You develop pain in the calves or back of the knee.  You develop chest pain, shortness of breath, or difficulty breathing.  You develop numbness or tingling in the leg or foot. MAKE SURE YOU:   Understand these instructions.  Will watch your condition.  Will get help right away if you are not doing well or you get worse. Document Released: 12/25/2004 Document Revised: 06/12/2013 Document Reviewed: 11/02/2012 [facility] Patient Information 2015 [place], [place]. This information is not intended to replace advice given to you by your health care provider. Make sure you discuss any questions you have with your health care provider.  Call your surgeon if you experience:   1.  Fever over 101.0. 2.  Inability to urinate. 3.  Nausea and/or vomiting. 4.  Extreme swelling or bruising at the surgical site. 5.  Continued bleeding from the incision. 6.  Increased pain, redness or drainage from the incision. 7.  Problems related to your pain medication. 8. Any change in color, movement and/or sensation 9. Any problems and/or concerns   Post Anesthesia Home Care Instructions  Activity: Get plenty of rest for the remainder of the day. A responsible adult should stay with you for 24 hours following the procedure.  For the next 24 hours, DO NOT: -Drive a car -Advertising copywriter -Drink alcoholic beverages -Take any medication  unless instructed by your physician -Make any legal decisions or sign important papers.  Meals: Start with liquid foods such as gelatin or soup. Progress to regular foods as tolerated. Avoid greasy, spicy, heavy foods. If nausea and/or vomiting occur, drink only clear liquids until the nausea and/or vomiting subsides. Call your physician if vomiting continues.  Special Instructions/Symptoms: Your throat may feel dry or sore from the anesthesia or the breathing tube placed in your throat during surgery. If this causes discomfort, gargle with warm salt water. The discomfort should disappear within 24 hours.

## 2014-07-19 ENCOUNTER — Encounter (HOSPITAL_BASED_OUTPATIENT_CLINIC_OR_DEPARTMENT_OTHER): Payer: Self-pay | Admitting: Orthopedic Surgery

## 2014-07-19 NOTE — Op Note (Signed)
NAME:  [name] Coleman, [name] Coleman               ACCOUNT NO.:  [id number]  MEDICAL RECORD NO.:  [id number]  LOCATION:                                 FACILITY:  PHYSICIAN:  Loreta Aveaniel F. Cariah Salatino, M.D. DATE OF BIRTH:  03/28/1986  DATE OF PROCEDURE:  07/18/2014 DATE OF DISCHARGE:  07/18/2014                              OPERATIVE REPORT   PREOPERATIVE DIAGNOSIS:  Left knee generalized inflammatory arthritis, probable gout.  Lateral meniscus tear.  POSTOPERATIVE DIAGNOSES:  Left knee generalized inflammatory arthritis, probable gout.  Lateral meniscus tear with extensive proliferative synovitis and crystalline deposits throughout the knee all compartments. Some grade 2 chondromalacia peak of the patella with a small area of grade 3 lateral femoral condyle.  Lateral meniscus tear involving almost the entirety of meniscus all around.  PROCEDURE:  Left knee exam under anesthesia, arthroscopy.  Synovial biopsy followed by extensive tricompartmental synovectomy everything about the popliteal hiatus.  Chondroplasty patella lateral femoral condyle.  Partial lateral meniscectomy.  SURGEON:  Loreta Aveaniel F. Kenly Henckel, MD  ASSISTANT:  Odelia GageLindsey Anton, PA  ANESTHESIA:  General.  BLOOD LOSS:  Minimal.  SPECIMENS:  None.  CULTURES:  None.  COMPLICATIONS:  None.  DRESSINGS:  Soft compressive.  TOURNIQUET:  Not employed.  DESCRIPTION OF PROCEDURE:  The patient was brought to the operating room, placed on the operating table in supine position.  After adequate anesthesia had been obtained, leg holder applied.  Leg prepped and draped in usual sterile fashion.  Two portals, one each medial and lateral parapatellar.  Arthroscope was introduced.  The knee distended and inspected.  Extensive hypertrophic synovitis throughout.  I did a number of synovial biopsies and then I had extensive synovectomy in all compartments.  Grade 2 changes on the patella and the peak debrided. Good tracking.  ACL intact.  Medial  meniscus, medial compartment intact. Laterally, complex tearing of the lateral meniscus all the way around. Saucerized out taping a little but of the rim over and around.  Entire knee examined.  No other findings were appreciated.  The area of grade 3 changes on the condyle also debrided.  Distended to the posterior.  Instruments and fluid removed.  Portals were closed with nylon.  Knee injected with Depo-Medrol and Marcaine.  Sterile compressive dressing applied.  Anesthesia reversed.  Brought to the recovery room.  Tolerated the surgery well.  No complications.     Loreta Aveaniel F. Albina Gosney, M.D.     DFM/MEDQ  D:  07/18/2014  T:  07/19/2014  Job:  [id number]

## 2019-04-06 ENCOUNTER — Other Ambulatory Visit: Payer: Self-pay

## 2019-04-06 DIAGNOSIS — Z20822 Contact with and (suspected) exposure to covid-19: Secondary | ICD-10-CM

## 2019-04-08 LAB — NOVEL CORONAVIRUS, NAA: SARS-CoV-2, NAA: NOT DETECTED

## 2019-04-09 ENCOUNTER — Other Ambulatory Visit: Payer: Self-pay

## 2019-04-09 DIAGNOSIS — Z20822 Contact with and (suspected) exposure to covid-19: Secondary | ICD-10-CM

## 2019-04-11 LAB — NOVEL CORONAVIRUS, NAA: SARS-CoV-2, NAA: NOT DETECTED

## 2019-07-31 ENCOUNTER — Ambulatory Visit: Payer: Self-pay | Attending: Internal Medicine

## 2019-07-31 DIAGNOSIS — Z20822 Contact with and (suspected) exposure to covid-19: Secondary | ICD-10-CM

## 2019-08-01 LAB — NOVEL CORONAVIRUS, NAA: SARS-CoV-2, NAA: DETECTED — AB

## 2019-08-02 ENCOUNTER — Encounter: Payer: Self-pay | Admitting: Nurse Practitioner

## 2019-08-02 ENCOUNTER — Telehealth (HOSPITAL_COMMUNITY): Payer: Self-pay | Admitting: Nurse Practitioner

## 2019-08-02 NOTE — Telephone Encounter (Signed)
Called to Discuss with patient about Covid symptoms and the use of bamlanivimab, a monoclonal antibody infusion for those with mild to moderate Covid symptoms and at a high risk of hospitalization.     Pt is qualified for this infusion at the [facility] infusion center due to co-morbid conditions and/or a member of an at-risk group.     Unable to reach pt. Left message. Sent mychart message.   Kiersten Coss, DNP, AGNP-C [phone] (Infusion Center Hotline)  

## 2019-08-03 ENCOUNTER — Telehealth: Payer: Self-pay | Admitting: Nurse Practitioner

## 2019-08-03 NOTE — Telephone Encounter (Signed)
Called to discuss with [name] about Covid symptoms and the use of bamlanivimab, a monoclonal antibody infusion for those with mild to moderate Covid symptoms and at a high risk of hospitalization.     Pt is qualified for this infusion at the [facility] infusion center due to co-morbid conditions and/or a member of an at-risk group.   Unable to reach. Voicemail left    Patient Active Problem List   Diagnosis Date Noted  . OBESITY 02/13/2009  . ESSENTIAL HYPERTENSION, BENIGN 02/13/2009    Willette Alma, AGPCNP-BC Pager: [phone] Amion: N. Cousar

## 2021-03-13 DIAGNOSIS — E559 Vitamin D deficiency, unspecified: Secondary | ICD-10-CM | POA: Diagnosis not present

## 2021-03-13 DIAGNOSIS — I129 Hypertensive chronic kidney disease with stage 1 through stage 4 chronic kidney disease, or unspecified chronic kidney disease: Secondary | ICD-10-CM | POA: Diagnosis not present

## 2021-03-13 DIAGNOSIS — Z23 Encounter for immunization: Secondary | ICD-10-CM | POA: Diagnosis not present

## 2021-03-13 DIAGNOSIS — Z0000 Encounter for general adult medical examination without abnormal findings: Secondary | ICD-10-CM | POA: Diagnosis not present

## 2021-03-13 DIAGNOSIS — R0683 Snoring: Secondary | ICD-10-CM | POA: Diagnosis not present

## 2021-03-13 DIAGNOSIS — R7303 Prediabetes: Secondary | ICD-10-CM | POA: Diagnosis not present

## 2021-03-13 DIAGNOSIS — M109 Gout, unspecified: Secondary | ICD-10-CM | POA: Diagnosis not present

## 2021-03-26 ENCOUNTER — Other Ambulatory Visit: Payer: Self-pay

## 2021-03-26 ENCOUNTER — Ambulatory Visit (INDEPENDENT_AMBULATORY_CARE_PROVIDER_SITE_OTHER): Payer: BC Managed Care – PPO | Admitting: Pulmonary Disease

## 2021-03-26 ENCOUNTER — Encounter: Payer: Self-pay | Admitting: Pulmonary Disease

## 2021-03-26 VITALS — BP 130/86 | HR 104 | Temp 99.0°F | Ht 73.0 in | Wt 346.2 lb

## 2021-03-26 DIAGNOSIS — G4719 Other hypersomnia: Secondary | ICD-10-CM

## 2021-03-26 NOTE — Progress Notes (Signed)
[name] Coleman    [id number]    [birth year]  Primary Care Physician:Swayne, Onalee Hua, MD  Referring Physician: Tally Joe, MD [phone] [place],  [place] [address]  Chief complaint:   Patient with snoring, witnessed apneas, concern for obstructive sleep apnea  HPI:  Snoring, witnessed apneas, concern for obstructive sleep apnea Nonrestorative sleep  Has been told by sibling and his mother about significant snoring Admits to dryness of his mouth in the morning Admits to occasional soreness No headaches No night sweats No inadvertent sleep  History of hypertension well-controlled  Usually goes to bed between 9 and 10 PM Falls asleep in an hour to an hour and a half 1-2 awakenings Final wake up time between 530 and 6 AM   Outpatient Encounter Medications as of 03/26/2021  Medication Sig   allopurinol (ZYLOPRIM) 300 MG tablet Take 200 mg by mouth daily.    ergocalciferol (VITAMIN D2) 1.25 MG (50000 UT) capsule Take 50,000 Units by mouth once a week.   ramipril (ALTACE) 10 MG capsule Take 10 mg by mouth daily.   atenolol (TENORMIN) 100 MG tablet TAKE 1/2 TABLET BY MOUTH ONCE A DAY! (Patient not taking: Reported on 03/26/2021)   colchicine 0.6 MG tablet Take 0.6 mg by mouth daily. (Patient not taking: Reported on 03/26/2021)   ondansetron (ZOFRAN) 4 MG tablet Take 1 tablet (4 mg total) by mouth every 8 (eight) hours as needed for nausea or vomiting. (Patient not taking: Reported on 03/26/2021)   oxyCODONE-acetaminophen (ROXICET) 5-325 MG per tablet Take 1-2 tablets by mouth every 4 (four) hours as needed. (Patient not taking: Reported on 03/26/2021)   No facility-administered encounter medications on file as of 03/26/2021.    Allergies as of 03/26/2021   (No Known Allergies)    Past Medical History:  Diagnosis Date   Arthritis    has Gout   CKD (chronic kidney disease), stage II    baseline Cr 1.4   Gout    Hypertension    Obesity     Past  Surgical History:  Procedure Laterality Date   CHONDROPLASTY Left 07/18/2014   Procedure: CHONDROPLASTY;  Surgeon: Loreta Ave, MD;  Location: [facility];  Service: Orthopedics;  Laterality: Left;   KNEE ARTHROSCOPY WITH LATERAL MENISECTOMY Left 07/18/2014   Procedure: LEFT KNEE ARTHROSCOPY WITH CHONDROPLASTY AND LATERAL MENISCECTOMY, PARTIAL SYNOVECTOMY;  Surgeon: Loreta Ave, MD;  Location: [facility];  Service: Orthopedics;  Laterality: Left;   Left thumb fracture and surgical repair  2002   SYNOVECTOMY Left 07/18/2014   Procedure: SYNOVECTOMY;  Surgeon: Loreta Ave, MD;  Location: [facility];  Service: Orthopedics;  Laterality: Left;    Family History  Problem Relation Age of Onset   Diabetes Father    Kidney disease Father    Stroke Father    Hypertension Sister    Diabetes Sister    Hypertension Sister    Kidney disease Sister     Social History   Socioeconomic History   Marital status: Single    Spouse name: Not on file   Number of children: Not on file   Years of education: Not on file   Highest education level: Not on file  Occupational History   Not on file  Tobacco Use   Smoking status: Former    Types: Cigarettes    Quit date: 07/10/2006    Years since quitting: 14.7  Smokeless tobacco: Not on file  Substance and Sexual Activity   Alcohol use: Yes    Comment: occassional   Drug use: No   Sexual activity: Not on file  Other Topics Concern   Not on file  Social History Narrative   Occupation: pizza delivery for [employer]   Single   Social Determinants of Health   Financial Resource Strain: Not on file  Food Insecurity: Not on file  Transportation Needs: Not on file  Physical Activity: Not on file  Stress: Not on file  Social Connections: Not on file  Intimate Partner Violence: Not on file    Review of Systems  Psychiatric/Behavioral:  Positive for sleep disturbance.    Vitals:   03/26/21 1600   BP: 130/86  Pulse: (!) 104  Temp: 99 F (37.2 C)  SpO2: 99%     Physical Exam Constitutional:      Appearance: He is obese.  HENT:     Right Ear: Tympanic membrane normal.     Nose: Nose normal. No congestion.     Mouth/Throat:     Mouth: Mucous membranes are moist.     Comments: Macroglossia, Mallampati 4, crowded oropharynx Cardiovascular:     Rate and Rhythm: Normal rate and regular rhythm.     Heart sounds: No murmur heard.   No friction rub.  Pulmonary:     Effort: No respiratory distress.     Breath sounds: No stridor. No wheezing or rhonchi.  Musculoskeletal:     Cervical back: No rigidity or tenderness.  Neurological:     Mental Status: He is alert.  Psychiatric:        Mood and Affect: Mood normal.   Results of the Epworth flowsheet 03/26/2021  Sitting and reading 1  Watching TV 2  Sitting, inactive in a public place (e.g. a theatre or a meeting) 1  As a passenger in a car for an hour without a break 2  Lying down to rest in the afternoon when circumstances permit 3  Sitting and talking to someone 1  Sitting quietly after a lunch without alcohol 2  In a car, while stopped for a few minutes in traffic 0  Total score 12    Data Reviewed: No previous studies  Assessment:  Excessive daytime sleepiness  Morbid obesity  Moderate probability of significant obstructive sleep apnea  Pathophysiology of sleep disordered breathing discussed with the patient Treatment options for sleep disordered breathing discussed with patient  Plan/Recommendations: Schedule patient for home sleep study  Weight loss efforts encouraged  Tentative follow-up in 3 months   Virl Diamond MD [facility] 03/26/2021, 4:17 PM  CC: Tally Joe, MD

## 2021-03-26 NOTE — Patient Instructions (Signed)
Moderate probability of significant obstructive sleep apnea  We will schedule you for home sleep study Update you with results as soon as reviewed  Continue weight loss efforts  Tentative follow-up in 3 months  Sleep Apnea Sleep apnea affects breathing during sleep. It causes breathing to stop for 10 seconds or more, or to become shallow. People with sleep apnea usually snore loudly. It can also increase the risk of: Heart attack. Stroke. Being very overweight (obese). Diabetes. Heart failure. Irregular heartbeat. High blood pressure. The goal of treatment is to help you breathe normally again. What are the causes? The most common cause of this condition is a collapsed or blocked airway. There are three kinds of sleep apnea: Obstructive sleep apnea. This is caused by a blocked or collapsed airway. Central sleep apnea. This happens when the brain does not send the right signals to the muscles that control breathing. Mixed sleep apnea. This is a combination of obstructive and central sleep apnea. What increases the risk? Being overweight. Smoking. Having a small airway. Being older. Being male. Drinking alcohol. Taking medicines to calm yourself (sedatives or tranquilizers). Having family members with the condition. Having a tongue or tonsils that are larger than normal. What are the signs or symptoms? Trouble staying asleep. Loud snoring. Headaches in the morning. Waking up gasping. Dry mouth or sore throat in the morning. Being sleepy or tired during the day. If you are sleepy or tired during the day, you may also: Not be able to focus your mind (concentrate). Forget things. Get angry a lot and have mood swings. Feel sad (depressed). Have changes in your personality. Have less interest in sex, if you are male. Be unable to have an erection, if you are male. How is this treated?  Sleeping on your side. Using a medicine to get rid of mucus in your nose  (decongestant). Avoiding the use of alcohol, medicines to help you relax, or certain pain medicines (narcotics). Losing weight, if needed. Changing your diet. Quitting smoking. Using a machine to open your airway while you sleep, such as: An oral appliance. This is a mouthpiece that shifts your lower jaw forward. A CPAP device. This device blows air through a mask when you breathe out (exhale). An EPAP device. This has valves that you put in each nostril. A BPAP device. This device blows air through a mask when you breathe in (inhale) and breathe out. Having surgery if other treatments do not work. Follow these instructions at home: Lifestyle Make changes that your doctor recommends. Eat a healthy diet. Lose weight if needed. Avoid alcohol, medicines to help you relax, and some pain medicines. Do not smoke or use any products that contain nicotine or tobacco. If you need help quitting, ask your doctor. General instructions Take over-the-counter and prescription medicines only as told by your doctor. If you were given a machine to use while you sleep, use it only as told by your doctor. If you are having surgery, make sure to tell your doctor you have sleep apnea. You may need to bring your device with you. Keep all follow-up visits. Contact a doctor if: The machine that you were given to use during sleep bothers you or does not seem to be working. You do not get better. You get worse. Get help right away if: Your chest hurts. You have trouble breathing in enough air. You have an uncomfortable feeling in your back, arms, or stomach. You have trouble talking. One side of your body feels  weak. A part of your face is hanging down. These symptoms may be an emergency. Get help right away. Call your local emergency services (911 in the [place] Do not wait to see if the symptoms will go away. Do not drive yourself to the hospital. Summary This condition affects breathing during  sleep. The most common cause is a collapsed or blocked airway. The goal of treatment is to help you breathe normally while you sleep. This information is not intended to replace advice given to you by your health care provider. Make sure you discuss any questions you have with your health care provider. Document Revised: 05/16/2020 Document Reviewed: 05/16/2020 Elsevier Patient Education  2022 [employer].

## 2021-04-29 ENCOUNTER — Telehealth: Payer: Self-pay | Admitting: Pulmonary Disease

## 2021-04-29 DIAGNOSIS — Z0189 Encounter for other specified special examinations: Secondary | ICD-10-CM

## 2021-04-29 NOTE — Telephone Encounter (Signed)
    Orders placed for HST,nothing further needed.

## 2021-04-29 NOTE — Telephone Encounter (Signed)
Pt needs to be scheduled ASAP for home sleep stud, patient was seen in office with Dr Wynona Neat on 10/6 and no one ever scheduled his HST. Will Contact [facility].

## 2021-05-12 NOTE — Telephone Encounter (Signed)
Pt is unhappy that because "we dropped the ball" regarding scheduling his HST that he has to wait even longer for HST. I did inform him that HST are 10 weeks out and he just got huffy and kept repeating how this was our problem. I informed him that the best I could personally do was to send message to PCPs and let them speak with him. Pt huffed and hung up.

## 2021-05-12 NOTE — Telephone Encounter (Signed)
Pt has been scheduled with Chantel tomorrow at 10 AM.  Nothing further needed at this time.

## 2021-05-12 NOTE — Telephone Encounter (Signed)
[name] Coleman had not gotten prior auth for this one.  Working on the PA so we can get scheduled.

## 2021-05-13 ENCOUNTER — Other Ambulatory Visit: Payer: Self-pay

## 2021-05-13 ENCOUNTER — Ambulatory Visit: Payer: BC Managed Care – PPO

## 2021-05-13 DIAGNOSIS — Z0189 Encounter for other specified special examinations: Secondary | ICD-10-CM

## 2021-05-13 DIAGNOSIS — G4733 Obstructive sleep apnea (adult) (pediatric): Secondary | ICD-10-CM | POA: Diagnosis not present

## 2021-05-30 ENCOUNTER — Telehealth: Payer: Self-pay | Admitting: Pulmonary Disease

## 2021-05-30 DIAGNOSIS — G4733 Obstructive sleep apnea (adult) (pediatric): Secondary | ICD-10-CM | POA: Diagnosis not present

## 2021-05-30 DIAGNOSIS — G4719 Other hypersomnia: Secondary | ICD-10-CM

## 2021-05-30 NOTE — Telephone Encounter (Signed)
Call patient  Sleep study result  Date of study: 05/13/2021  Impression: Mild obstructive sleep apnea Moderate oxygen desaturations Severity of sleep apnea may have been underestimated during current study-many periods of data loss from nasal sensor noted  Recommendation:  Options of treatment for mild obstructive sleep apnea  CPAP therapy may be considered if patient has significant daytime symptoms that can be ascribed to be an effect of sleep disordered breathing. If CPAP therapy is chosen as option of treatment, auto titrating CPAP with pressure settings of 5-15 will be appropriate  An oral device may also be considered as an option of treatment for mild sleep disordered breathing  Watchful waiting with focus on weight loss and sleep position modification may also be appropriate if not symptomatic.  Sleep position optimization by encouraging sleep in a lateral position, elevating the head of the bed by about 30 degrees may help  Close clinical follow up with compliance monitoring to optimize therapeutic efficiency Weight loss measures encouraged   Follow-up as previously scheduled

## 2021-06-01 NOTE — Telephone Encounter (Signed)
I called the patient and he wants to go forward with the CPAP at this time and I have placed an order for a CPAP. He did not have any questions. Nothing further needed.

## 2021-06-23 DIAGNOSIS — G4733 Obstructive sleep apnea (adult) (pediatric): Secondary | ICD-10-CM | POA: Diagnosis not present

## 2021-06-26 ENCOUNTER — Ambulatory Visit (INDEPENDENT_AMBULATORY_CARE_PROVIDER_SITE_OTHER): Payer: BC Managed Care – PPO | Admitting: Pulmonary Disease

## 2021-06-26 ENCOUNTER — Other Ambulatory Visit: Payer: Self-pay

## 2021-06-26 ENCOUNTER — Encounter: Payer: Self-pay | Admitting: Pulmonary Disease

## 2021-06-26 VITALS — BP 130/80 | HR 83 | Temp 98.0°F | Ht 73.0 in | Wt 342.0 lb

## 2021-06-26 DIAGNOSIS — G4733 Obstructive sleep apnea (adult) (pediatric): Secondary | ICD-10-CM

## 2021-06-26 DIAGNOSIS — Z9989 Dependence on other enabling machines and devices: Secondary | ICD-10-CM

## 2021-06-26 NOTE — Progress Notes (Signed)
[name] Coleman    [id number]    01-06-1986  Primary Care Physician:Coleman, [name] Hua, MD  Referring Physician: Tally Joe, MD [phone] [place],  [place] [address]  Chief complaint:   Moderate obstructive sleep apnea  HPI:  Just received a CPAP within the week  Is adjusting to it   Diagnosed with moderate obstructive sleep apnea  Has been told by sibling and his mother about significant snoring Admits to dryness of his mouth in the morning Admits to occasional soreness No headaches No night sweats No inadvertent sleep  History of hypertension well-controlled  Usually goes to bed between 9 and 10 PM Falls asleep in an hour to an hour and a half 1-2 awakenings Final wake up time between 530 and 6 AM   Outpatient Encounter Medications as of 06/26/2021  Medication Sig   allopurinol (ZYLOPRIM) 300 MG tablet Take 200 mg by mouth daily.    atenolol (TENORMIN) 100 MG tablet TAKE 1/2 TABLET BY MOUTH ONCE A DAY!   colchicine 0.6 MG tablet Take 0.6 mg by mouth daily.   ergocalciferol (VITAMIN D2) 1.25 MG (50000 UT) capsule Take 50,000 Units by mouth once a week.   ramipril (ALTACE) 10 MG capsule Take 10 mg by mouth daily.   [DISCONTINUED] ondansetron (ZOFRAN) 4 MG tablet Take 1 tablet (4 mg total) by mouth every 8 (eight) hours as needed for nausea or vomiting. (Patient not taking: Reported on 03/26/2021)   [DISCONTINUED] oxyCODONE-acetaminophen (ROXICET) 5-325 MG per tablet Take 1-2 tablets by mouth every 4 (four) hours as needed. (Patient not taking: Reported on 03/26/2021)   No facility-administered encounter medications on file as of 06/26/2021.    Allergies as of 06/26/2021   (No Known Allergies)    Past Medical History:  Diagnosis Date   Arthritis    has Gout   CKD (chronic kidney disease), stage II    baseline Cr 1.4   Gout    Hypertension    Obesity     Past Surgical History:  Procedure Laterality Date   CHONDROPLASTY Left  07/18/2014   Procedure: CHONDROPLASTY;  Surgeon: Loreta Ave, MD;  Location: [facility];  Service: Orthopedics;  Laterality: Left;   KNEE ARTHROSCOPY WITH LATERAL MENISECTOMY Left 07/18/2014   Procedure: LEFT KNEE ARTHROSCOPY WITH CHONDROPLASTY AND LATERAL MENISCECTOMY, PARTIAL SYNOVECTOMY;  Surgeon: Loreta Ave, MD;  Location: [facility];  Service: Orthopedics;  Laterality: Left;   Left thumb fracture and surgical repair  2002   SYNOVECTOMY Left 07/18/2014   Procedure: SYNOVECTOMY;  Surgeon: Loreta Ave, MD;  Location: [facility];  Service: Orthopedics;  Laterality: Left;    Family History  Problem Relation Age of Onset   Diabetes Father    Kidney disease Father    Stroke Father    Hypertension Sister    Diabetes Sister    Hypertension Sister    Kidney disease Sister     Social History   Socioeconomic History   Marital status: Single    Spouse name: Not on file   Number of children: Not on file   Years of education: Not on file   Highest education level: Not on file  Occupational History   Not on file  Tobacco Use   Smoking status: Former    Types: Cigarettes    Quit date: 07/10/2006    Years since quitting: 14.9   Smokeless tobacco: Not on file  Substance and Sexual Activity   Alcohol use: Yes    Comment: occassional   Drug use: No   Sexual activity: Not on file  Other Topics Concern   Not on file  Social History Narrative   Occupation: pizza delivery for [employer]   Single   Social Determinants of Health   Financial Resource Strain: Not on file  Food Insecurity: Not on file  Transportation Needs: Not on file  Physical Activity: Not on file  Stress: Not on file  Social Connections: Not on file  Intimate Partner Violence: Not on file    Review of Systems  Psychiatric/Behavioral:  Positive for sleep disturbance.    Vitals:   06/26/21 1609  BP: 130/80  Pulse: 83  Temp: 98 F (36.7 C)  SpO2: 96%      Physical Exam Constitutional:      Appearance: He is obese.  HENT:     Right Ear: Tympanic membrane normal.     Nose: Nose normal. No congestion.     Mouth/Throat:     Mouth: Mucous membranes are moist.     Comments: Macroglossia, Mallampati 4, crowded oropharynx Cardiovascular:     Rate and Rhythm: Normal rate and regular rhythm.     Heart sounds: No murmur heard.   No friction rub.  Pulmonary:     Effort: No respiratory distress.     Breath sounds: No stridor. No wheezing or rhonchi.  Musculoskeletal:     Cervical back: No rigidity or tenderness.  Neurological:     Mental Status: He is alert.  Psychiatric:        Mood and Affect: Mood normal.   Results of the Epworth flowsheet 03/26/2021  Sitting and reading 1  Watching TV 2  Sitting, inactive in a public place (e.g. a theatre or a meeting) 1  As a passenger in a car for an hour without a break 2  Lying down to rest in the afternoon when circumstances permit 3  Sitting and talking to someone 1  Sitting quietly after a lunch without alcohol 2  In a car, while stopped for a few minutes in traffic 0  Total score 12    Data Reviewed: No previous studies  Assessment:  Excessive daytime sleepiness  Moderate obstructive sleep apnea  Continue weight loss efforts   Plan/Recommendations:  Continue CPAP use nightly  I will see you in 3 to 4 months  [name] Diamond MD [facility] 06/26/2021, 4:22 PM  CC: [name] Joe, MD

## 2021-06-26 NOTE — Patient Instructions (Signed)
Continue using her CPAP on a regular basis  I will see you in about 3 to 4 months  Call us with any significant concerns  Continue with your weight loss efforts

## 2021-07-24 DIAGNOSIS — G4733 Obstructive sleep apnea (adult) (pediatric): Secondary | ICD-10-CM | POA: Diagnosis not present

## 2021-08-21 DIAGNOSIS — G4733 Obstructive sleep apnea (adult) (pediatric): Secondary | ICD-10-CM | POA: Diagnosis not present

## 2021-09-17 DIAGNOSIS — N1832 Chronic kidney disease, stage 3b: Secondary | ICD-10-CM | POA: Diagnosis not present

## 2021-09-17 DIAGNOSIS — D696 Thrombocytopenia, unspecified: Secondary | ICD-10-CM | POA: Diagnosis not present

## 2021-09-17 DIAGNOSIS — M109 Gout, unspecified: Secondary | ICD-10-CM | POA: Diagnosis not present

## 2021-09-17 DIAGNOSIS — E559 Vitamin D deficiency, unspecified: Secondary | ICD-10-CM | POA: Diagnosis not present

## 2021-09-17 DIAGNOSIS — R7303 Prediabetes: Secondary | ICD-10-CM | POA: Diagnosis not present

## 2021-09-17 DIAGNOSIS — I129 Hypertensive chronic kidney disease with stage 1 through stage 4 chronic kidney disease, or unspecified chronic kidney disease: Secondary | ICD-10-CM | POA: Diagnosis not present

## 2021-09-21 DIAGNOSIS — G4733 Obstructive sleep apnea (adult) (pediatric): Secondary | ICD-10-CM | POA: Diagnosis not present

## 2021-09-28 ENCOUNTER — Encounter: Payer: Self-pay | Admitting: Pulmonary Disease

## 2021-09-28 ENCOUNTER — Ambulatory Visit: Payer: BC Managed Care – PPO | Admitting: Pulmonary Disease

## 2021-09-28 VITALS — BP 122/80 | HR 100 | Temp 97.8°F | Ht 73.0 in | Wt 345.6 lb

## 2021-09-28 DIAGNOSIS — Z9989 Dependence on other enabling machines and devices: Secondary | ICD-10-CM | POA: Diagnosis not present

## 2021-09-28 DIAGNOSIS — G4733 Obstructive sleep apnea (adult) (pediatric): Secondary | ICD-10-CM

## 2021-09-28 NOTE — Patient Instructions (Signed)
Continue using CPAP on a nightly basis ? ?Try and get about 6 to 8 hours of sleep every night ? ?Regular exercise as tolerated ? ?Weight loss efforts encouraged ? ?I will see you in 6 months ?

## 2021-09-28 NOTE — Progress Notes (Signed)
? ?      [name] Coleman    [id number]    1985/08/01 ? ?Primary Care Physician:Swayne, Shanon Brow, MD ? ?Referring Physician: Antony Contras, MD ?[address] ?[address] ?[place],  [place] [address] ? ?Chief complaint:   ?Moderate obstructive sleep apnea ? ?HPI: ? ?Has been using CPAP regularly ?Getting a good number of hours of sleep ? ?Whenever he wakes up in the morning feels like he is at a good nights rest ? ?Diagnosed with moderate obstructive sleep apnea ? ?Daytime sleepiness is better ?No morning headaches ?No night sweats ? ?History of hypertension well-controlled ? ?Usually goes to bed between 9 and 10 PM ?Falls asleep in an hour to an hour and a half ?1-2 awakenings ?Final wake up time between 530 and 6 AM ? ? ?Outpatient Encounter Medications as of 09/28/2021  ?Medication Sig  ? allopurinol (ZYLOPRIM) 300 MG tablet Take 200 mg by mouth daily.   ? atenolol (TENORMIN) 100 MG tablet TAKE 1/2 TABLET BY MOUTH ONCE A DAY!  ? ergocalciferol (VITAMIN D2) 1.25 MG (50000 UT) capsule Take 50,000 Units by mouth once a week.  ? telmisartan (MICARDIS) 80 MG tablet 1 tablet  ? colchicine 0.6 MG tablet Take 0.6 mg by mouth daily. (Patient not taking: Reported on 09/28/2021)  ? [DISCONTINUED] ramipril (ALTACE) 10 MG capsule Take 10 mg by mouth daily.  ? ?No facility-administered encounter medications on file as of 09/28/2021.  ? ? ?Allergies as of 09/28/2021  ? (No Known Allergies)  ? ? ?Past Medical History:  ?Diagnosis Date  ? Arthritis   ? has Gout  ? CKD (chronic kidney disease), stage II   ? baseline Cr 1.4  ? Gout   ? Hypertension   ? Obesity   ? ? ?Past Surgical History:  ?Procedure Laterality Date  ? CHONDROPLASTY Left 07/18/2014  ? Procedure: CHONDROPLASTY;  Surgeon: Ninetta Lights, MD;  Location: [facility];  Service: Orthopedics;  Laterality: Left;  ? KNEE ARTHROSCOPY WITH LATERAL MENISECTOMY Left 07/18/2014  ? Procedure: LEFT KNEE ARTHROSCOPY WITH CHONDROPLASTY AND LATERAL MENISCECTOMY, PARTIAL  SYNOVECTOMY;  Surgeon: Ninetta Lights, MD;  Location: [facility];  Service: Orthopedics;  Laterality: Left;  ? Left thumb fracture and surgical repair  2002  ? SYNOVECTOMY Left 07/18/2014  ? Procedure: SYNOVECTOMY;  Surgeon: Ninetta Lights, MD;  Location: [facility];  Service: Orthopedics;  Laterality: Left;  ? ? ?Family History  ?Problem Relation Age of Onset  ? Diabetes Father   ? Kidney disease Father   ? Stroke Father   ? Hypertension Sister   ? Diabetes Sister   ? Hypertension Sister   ? Kidney disease Sister   ? ? ?Social History  ? ?Socioeconomic History  ? Marital status: Single  ?  Spouse name: Not on file  ? Number of children: Not on file  ? Years of education: Not on file  ? Highest education level: Not on file  ?Occupational History  ? Not on file  ?Tobacco Use  ? Smoking status: Former  ?  Types: Cigarettes  ?  Quit date: 07/10/2006  ?  Years since quitting: 15.2  ? Smokeless tobacco: Not on file  ?Substance and Sexual Activity  ? Alcohol use: Yes  ?  Comment: occassional  ? Drug use: No  ? Sexual activity: Not on file  ?Other Topics Concern  ? Not on file  ?Social History Narrative  ? Occupation: pizza delivery for [employer]  ?  Single  ? ?Social Determinants of Health  ? ?Financial Resource Strain: Not on file  ?Food Insecurity: Not on file  ?Transportation Needs: Not on file  ?Physical Activity: Not on file  ?Stress: Not on file  ?Social Connections: Not on file  ?Intimate Partner Violence: Not on file  ? ? ?Review of Systems  ?Respiratory:  Positive for apnea.   ?Psychiatric/Behavioral:  Positive for sleep disturbance.   ? ?Vitals:  ? 09/28/21 1607  ?BP: 122/80  ?Pulse: 100  ?Temp: 97.8 ?F (36.6 ?C)  ?SpO2: 97%  ? ? ? ?Physical Exam ?Constitutional:   ?   Appearance: He is obese.  ?HENT:  ?   Right Ear: Tympanic membrane normal.  ?   Nose: Nose normal. No congestion.  ?   Mouth/Throat:  ?   Mouth: Mucous membranes are moist.  ?   Comments: Macroglossia, Mallampati 4,  crowded oropharynx ?Cardiovascular:  ?   Rate and Rhythm: Normal rate and regular rhythm.  ?   Heart sounds: No murmur heard. ?  No friction rub.  ?Pulmonary:  ?   Effort: No respiratory distress.  ?   Breath sounds: No stridor. No wheezing or rhonchi.  ?Musculoskeletal:  ?   Cervical back: No rigidity or tenderness.  ?Neurological:  ?   Mental Status: He is alert.  ?Psychiatric:     ?   Mood and Affect: Mood normal.  ? ? ?  03/26/2021  ?  3:00 PM  ?Results of the Epworth flowsheet  ?Sitting and reading 1  ?Watching TV 2  ?Sitting, inactive in a public place (e.g. a theatre or a meeting) 1  ?As a passenger in a car for an hour without a break 2  ?Lying down to rest in the afternoon when circumstances permit 3  ?Sitting and talking to someone 1  ?Sitting quietly after a lunch without alcohol 2  ?In a car, while stopped for a few minutes in traffic 0  ?Total score 12  ? ? ?Data Reviewed: ?Sleep study showing moderate obstructive sleep apnea on CPAP therapy ? ?Compliance download not available today ? ?Assessment:  ?Moderate obstructive sleep apnea on CPAP therapy ? ?Obesity ? ?Compliant with CPAP ? ? ?Plan/Recommendations: ? ?Continue CPAP use nightly ? ?Call with significant concerns ? ?Weight loss efforts encouraged ? ?Follow-up in 6 months ? ?Sherrilyn Rist MD ?[facility] ?09/28/2021, 4:27 PM ? ?CC: Antony Contras, MD ? ? ? ?

## 2021-10-01 ENCOUNTER — Telehealth: Payer: Self-pay | Admitting: Pulmonary Disease

## 2021-10-01 NOTE — Telephone Encounter (Signed)
Patient has a card to cloud pap unit. he will need to bring in his download card to have machine read.   I did add you to his air view account, however there are is no data at this time.  ? ?Thank you,  ? ?Brad New  ?

## 2021-10-15 DIAGNOSIS — I129 Hypertensive chronic kidney disease with stage 1 through stage 4 chronic kidney disease, or unspecified chronic kidney disease: Secondary | ICD-10-CM | POA: Diagnosis not present

## 2021-10-21 DIAGNOSIS — G4733 Obstructive sleep apnea (adult) (pediatric): Secondary | ICD-10-CM | POA: Diagnosis not present

## 2021-11-21 DIAGNOSIS — G4733 Obstructive sleep apnea (adult) (pediatric): Secondary | ICD-10-CM | POA: Diagnosis not present

## 2021-12-21 DIAGNOSIS — G4733 Obstructive sleep apnea (adult) (pediatric): Secondary | ICD-10-CM | POA: Diagnosis not present

## 2022-01-21 DIAGNOSIS — G4733 Obstructive sleep apnea (adult) (pediatric): Secondary | ICD-10-CM | POA: Diagnosis not present

## 2022-02-21 DIAGNOSIS — G4733 Obstructive sleep apnea (adult) (pediatric): Secondary | ICD-10-CM | POA: Diagnosis not present

## 2022-03-22 DIAGNOSIS — D696 Thrombocytopenia, unspecified: Secondary | ICD-10-CM | POA: Diagnosis not present

## 2022-03-22 DIAGNOSIS — E559 Vitamin D deficiency, unspecified: Secondary | ICD-10-CM | POA: Diagnosis not present

## 2022-03-22 DIAGNOSIS — N1832 Chronic kidney disease, stage 3b: Secondary | ICD-10-CM | POA: Diagnosis not present

## 2022-03-22 DIAGNOSIS — M109 Gout, unspecified: Secondary | ICD-10-CM | POA: Diagnosis not present

## 2022-03-22 DIAGNOSIS — R7303 Prediabetes: Secondary | ICD-10-CM | POA: Diagnosis not present

## 2022-03-22 DIAGNOSIS — I129 Hypertensive chronic kidney disease with stage 1 through stage 4 chronic kidney disease, or unspecified chronic kidney disease: Secondary | ICD-10-CM | POA: Diagnosis not present

## 2022-03-30 ENCOUNTER — Ambulatory Visit: Payer: BC Managed Care – PPO | Admitting: Pulmonary Disease

## 2022-03-30 ENCOUNTER — Encounter: Payer: Self-pay | Admitting: Pulmonary Disease

## 2022-03-30 VITALS — BP 118/80 | HR 94 | Temp 98.0°F | Ht 73.0 in | Wt 338.0 lb

## 2022-03-30 DIAGNOSIS — G4733 Obstructive sleep apnea (adult) (pediatric): Secondary | ICD-10-CM

## 2022-03-30 NOTE — Progress Notes (Signed)
[name] Coleman    [id number]    1985-11-04  Primary Care Physician:Swayne, Shanon Brow, MD  Referring Physician: Antony Contras, MD [address] [place],  [place] [address]  Chief complaint:   Moderate obstructive sleep apnea  HPI:  Continues to use CPAP regularly Benefiting from CPAP use  No significant daytime sleepiness  Waking up feeling like he is at a good nights rest  Was diagnosed with moderate obstructive sleep apnea and has been using CPAP nightly  No morning headaches No night sweats  History of hypertension well-controlled  Usually goes to bed between 9 and 10 PM Falls asleep in an hour to an hour and a half 1-2 awakenings Final wake up time between 530 and 6 AM   Outpatient Encounter Medications as of 03/30/2022  Medication Sig   allopurinol (ZYLOPRIM) 300 MG tablet Take 200 mg by mouth daily.    atenolol (TENORMIN) 100 MG tablet TAKE 1/2 TABLET BY MOUTH ONCE A DAY!   colchicine 0.6 MG tablet Take 0.6 mg by mouth daily.   ergocalciferol (VITAMIN D2) 1.25 MG (50000 UT) capsule Take 50,000 Units by mouth once a week.   telmisartan (MICARDIS) 80 MG tablet 1 tablet   No facility-administered encounter medications on file as of 03/30/2022.    Allergies as of 03/30/2022   (No Known Allergies)    Past Medical History:  Diagnosis Date   Arthritis    has Gout   CKD (chronic kidney disease), stage II    baseline Cr 1.4   Gout    Hypertension    Obesity     Past Surgical History:  Procedure Laterality Date   CHONDROPLASTY Left 07/18/2014   Procedure: CHONDROPLASTY;  Surgeon: Ninetta Lights, MD;  Location: [facility];  Service: Orthopedics;  Laterality: Left;   KNEE ARTHROSCOPY WITH LATERAL MENISECTOMY Left 07/18/2014   Procedure: LEFT KNEE ARTHROSCOPY WITH CHONDROPLASTY AND LATERAL MENISCECTOMY, PARTIAL SYNOVECTOMY;  Surgeon: Ninetta Lights, MD;  Location: [facility];  Service: Orthopedics;   Laterality: Left;   Left thumb fracture and surgical repair  2002   SYNOVECTOMY Left 07/18/2014   Procedure: SYNOVECTOMY;  Surgeon: Ninetta Lights, MD;  Location: [facility];  Service: Orthopedics;  Laterality: Left;    Family History  Problem Relation Age of Onset   Diabetes Father    Kidney disease Father    Stroke Father    Hypertension Sister    Diabetes Sister    Hypertension Sister    Kidney disease Sister     Social History   Socioeconomic History   Marital status: Single    Spouse name: Not on file   Number of children: Not on file   Years of education: Not on file   Highest education level: Not on file  Occupational History   Not on file  Tobacco Use   Smoking status: Former    Types: Cigarettes    Quit date: 07/10/2006    Years since quitting: 15.7   Smokeless tobacco: Not on file  Substance and Sexual Activity   Alcohol use: Yes    Comment: occassional   Drug use: No   Sexual activity: Not on file  Other Topics Concern   Not on file  Social History Narrative   Occupation: pizza delivery for [employer]   Single   Social Determinants of Health   Financial Resource Strain: Not on file  Food Insecurity: Not on  file  Transportation Needs: Not on file  Physical Activity: Not on file  Stress: Not on file  Social Connections: Not on file  Intimate Partner Violence: Not on file    Review of Systems  Respiratory:  Positive for apnea.   Psychiatric/Behavioral:  Positive for sleep disturbance.     There were no vitals filed for this visit.    Physical Exam Constitutional:      Appearance: He is obese.  HENT:     Right Ear: Tympanic membrane normal.     Nose: Nose normal. No congestion.     Mouth/Throat:     Mouth: Mucous membranes are moist.     Comments: Macroglossia, Mallampati 4, crowded oropharynx Cardiovascular:     Rate and Rhythm: Normal rate and regular rhythm.     Heart sounds: No murmur heard.    No friction rub.   Pulmonary:     Effort: No respiratory distress.     Breath sounds: No stridor. No wheezing or rhonchi.  Musculoskeletal:     Cervical back: No rigidity or tenderness.  Neurological:     Mental Status: He is alert.  Psychiatric:        Mood and Affect: Mood normal.       03/26/2021    3:00 PM  Results of the Epworth flowsheet  Sitting and reading 1  Watching TV 2  Sitting, inactive in a public place (e.g. a theatre or a meeting) 1  As a passenger in a car for an hour without a break 2  Lying down to rest in the afternoon when circumstances permit 3  Sitting and talking to someone 1  Sitting quietly after a lunch without alcohol 2  In a car, while stopped for a few minutes in traffic 0  Total score 12    Data Reviewed: Sleep study showing moderate obstructive sleep apnea on CPAP therapy  Compliance data reviewed showing 93% compliance Machine set between 5 and 15 95 percentile pressure of 11.9 AHI of 4.5  Assessment:  Moderate obstructive sleep apnea on CPAP therapy -Continues to benefit from CPAP use  Compliant with CPAP use  Class III obesity   Plan/Recommendations:  Continue nightly CPAP use  Encourage weight loss efforts  Follow-up in a year  Virl Diamond MD [facility] 03/30/2022, 11:08 AM  CC: Tally Joe, MD

## 2022-03-30 NOTE — Patient Instructions (Signed)
Continue using CPAP nightly  I will see you a year from now  Call us with any significant concerns

## 2022-05-20 DIAGNOSIS — G4733 Obstructive sleep apnea (adult) (pediatric): Secondary | ICD-10-CM | POA: Diagnosis not present

## 2022-06-19 DIAGNOSIS — G4733 Obstructive sleep apnea (adult) (pediatric): Secondary | ICD-10-CM | POA: Diagnosis not present

## 2022-07-20 DIAGNOSIS — G4733 Obstructive sleep apnea (adult) (pediatric): Secondary | ICD-10-CM | POA: Diagnosis not present

## 2022-09-21 DIAGNOSIS — I129 Hypertensive chronic kidney disease with stage 1 through stage 4 chronic kidney disease, or unspecified chronic kidney disease: Secondary | ICD-10-CM | POA: Diagnosis not present

## 2022-09-21 DIAGNOSIS — D696 Thrombocytopenia, unspecified: Secondary | ICD-10-CM | POA: Diagnosis not present

## 2022-09-21 DIAGNOSIS — M109 Gout, unspecified: Secondary | ICD-10-CM | POA: Diagnosis not present

## 2022-09-21 DIAGNOSIS — E559 Vitamin D deficiency, unspecified: Secondary | ICD-10-CM | POA: Diagnosis not present

## 2022-09-21 DIAGNOSIS — E782 Mixed hyperlipidemia: Secondary | ICD-10-CM | POA: Diagnosis not present

## 2022-09-21 DIAGNOSIS — R7303 Prediabetes: Secondary | ICD-10-CM | POA: Diagnosis not present

## 2022-09-21 DIAGNOSIS — N1832 Chronic kidney disease, stage 3b: Secondary | ICD-10-CM | POA: Diagnosis not present

## 2023-03-24 DIAGNOSIS — R7303 Prediabetes: Secondary | ICD-10-CM | POA: Diagnosis not present

## 2023-03-24 DIAGNOSIS — N1832 Chronic kidney disease, stage 3b: Secondary | ICD-10-CM | POA: Diagnosis not present

## 2023-03-24 DIAGNOSIS — E782 Mixed hyperlipidemia: Secondary | ICD-10-CM | POA: Diagnosis not present

## 2023-03-24 DIAGNOSIS — D696 Thrombocytopenia, unspecified: Secondary | ICD-10-CM | POA: Diagnosis not present

## 2023-03-24 DIAGNOSIS — E559 Vitamin D deficiency, unspecified: Secondary | ICD-10-CM | POA: Diagnosis not present

## 2023-03-24 DIAGNOSIS — I129 Hypertensive chronic kidney disease with stage 1 through stage 4 chronic kidney disease, or unspecified chronic kidney disease: Secondary | ICD-10-CM | POA: Diagnosis not present

## 2023-03-24 DIAGNOSIS — M109 Gout, unspecified: Secondary | ICD-10-CM | POA: Diagnosis not present

## 2023-04-21 DIAGNOSIS — J069 Acute upper respiratory infection, unspecified: Secondary | ICD-10-CM | POA: Diagnosis not present

## 2023-05-03 DIAGNOSIS — G4733 Obstructive sleep apnea (adult) (pediatric): Secondary | ICD-10-CM | POA: Diagnosis not present

## 2023-06-02 DIAGNOSIS — G4733 Obstructive sleep apnea (adult) (pediatric): Secondary | ICD-10-CM | POA: Diagnosis not present

## 2023-09-19 DIAGNOSIS — G4733 Obstructive sleep apnea (adult) (pediatric): Secondary | ICD-10-CM | POA: Diagnosis not present

## 2023-09-22 DIAGNOSIS — N1832 Chronic kidney disease, stage 3b: Secondary | ICD-10-CM | POA: Diagnosis not present

## 2023-09-22 DIAGNOSIS — R7303 Prediabetes: Secondary | ICD-10-CM | POA: Diagnosis not present

## 2023-09-22 DIAGNOSIS — E782 Mixed hyperlipidemia: Secondary | ICD-10-CM | POA: Diagnosis not present

## 2023-09-22 DIAGNOSIS — I129 Hypertensive chronic kidney disease with stage 1 through stage 4 chronic kidney disease, or unspecified chronic kidney disease: Secondary | ICD-10-CM | POA: Diagnosis not present

## 2023-09-22 DIAGNOSIS — D696 Thrombocytopenia, unspecified: Secondary | ICD-10-CM | POA: Diagnosis not present

## 2023-09-22 DIAGNOSIS — E559 Vitamin D deficiency, unspecified: Secondary | ICD-10-CM | POA: Diagnosis not present

## 2023-09-22 DIAGNOSIS — M109 Gout, unspecified: Secondary | ICD-10-CM | POA: Diagnosis not present

## 2023-10-19 DIAGNOSIS — G4733 Obstructive sleep apnea (adult) (pediatric): Secondary | ICD-10-CM | POA: Diagnosis not present

## 2023-11-19 DIAGNOSIS — G4733 Obstructive sleep apnea (adult) (pediatric): Secondary | ICD-10-CM | POA: Diagnosis not present

## 2023-12-28 DIAGNOSIS — G4733 Obstructive sleep apnea (adult) (pediatric): Secondary | ICD-10-CM | POA: Diagnosis not present

## 2024-01-28 DIAGNOSIS — G4733 Obstructive sleep apnea (adult) (pediatric): Secondary | ICD-10-CM | POA: Diagnosis not present

## 2024-02-28 DIAGNOSIS — G4733 Obstructive sleep apnea (adult) (pediatric): Secondary | ICD-10-CM | POA: Diagnosis not present

## 2024-03-27 DIAGNOSIS — N1832 Chronic kidney disease, stage 3b: Secondary | ICD-10-CM | POA: Diagnosis not present

## 2024-03-27 DIAGNOSIS — M109 Gout, unspecified: Secondary | ICD-10-CM | POA: Diagnosis not present

## 2024-03-27 DIAGNOSIS — I129 Hypertensive chronic kidney disease with stage 1 through stage 4 chronic kidney disease, or unspecified chronic kidney disease: Secondary | ICD-10-CM | POA: Diagnosis not present

## 2024-03-27 DIAGNOSIS — D696 Thrombocytopenia, unspecified: Secondary | ICD-10-CM | POA: Diagnosis not present

## 2024-03-27 DIAGNOSIS — Z23 Encounter for immunization: Secondary | ICD-10-CM | POA: Diagnosis not present

## 2024-03-27 DIAGNOSIS — E559 Vitamin D deficiency, unspecified: Secondary | ICD-10-CM | POA: Diagnosis not present

## 2024-03-27 DIAGNOSIS — E782 Mixed hyperlipidemia: Secondary | ICD-10-CM | POA: Diagnosis not present

## 2024-03-27 DIAGNOSIS — R7303 Prediabetes: Secondary | ICD-10-CM | POA: Diagnosis not present

## 2024-05-06 DIAGNOSIS — G4733 Obstructive sleep apnea (adult) (pediatric): Secondary | ICD-10-CM | POA: Diagnosis not present
